# Patient Record
Sex: Male | Born: 1955
Health system: Southern US, Community
[De-identification: ages and names within clinical notes are randomized; demographics above are authoritative.]

## PROBLEM LIST (undated history)

## (undated) DIAGNOSIS — H9190 Unspecified hearing loss, unspecified ear: Secondary | ICD-10-CM

## (undated) DIAGNOSIS — N2889 Other specified disorders of kidney and ureter: Secondary | ICD-10-CM

## (undated) DIAGNOSIS — I4891 Unspecified atrial fibrillation: Secondary | ICD-10-CM

## (undated) DIAGNOSIS — I5023 Acute on chronic systolic (congestive) heart failure: Secondary | ICD-10-CM

## (undated) DIAGNOSIS — I499 Cardiac arrhythmia, unspecified: Secondary | ICD-10-CM

## (undated) DIAGNOSIS — C801 Malignant (primary) neoplasm, unspecified: Secondary | ICD-10-CM

## (undated) DIAGNOSIS — I10 Essential (primary) hypertension: Secondary | ICD-10-CM

## (undated) DIAGNOSIS — Z973 Presence of spectacles and contact lenses: Secondary | ICD-10-CM

## (undated) HISTORY — PX: HYPOSPADIAS CORRECTION: SHX483

## (undated) HISTORY — PX: WISDOM TOOTH EXTRACTION: SHX21

## (undated) HISTORY — PX: COLONOSCOPY: SHX5424

---

## 2014-04-17 ENCOUNTER — Emergency Department (HOSPITAL_COMMUNITY)
Admission: EM | Admit: 2014-04-17 | Discharge: 2014-04-17 | Disposition: A | Discharge status: Home / Self Care | Payer: 59 | Attending: Emergency Medicine | Admitting: Emergency Medicine

## 2014-04-17 DIAGNOSIS — IMO0001 Reserved for inherently not codable concepts without codable children: Secondary | ICD-10-CM

## 2014-04-17 DIAGNOSIS — Z79899 Other long term (current) drug therapy: Secondary | ICD-10-CM | POA: Diagnosis not present

## 2014-04-17 DIAGNOSIS — R030 Elevated blood-pressure reading, without diagnosis of hypertension: Secondary | ICD-10-CM | POA: Insufficient documentation

## 2014-04-17 LAB — COMPREHENSIVE METABOLIC PANEL
ALT: 14 U/L (ref 0–53)
AST: 18 U/L (ref 0–37)
Albumin: 4.3 g/dL (ref 3.5–5.2)
Alkaline Phosphatase: 44 U/L (ref 39–117)
Anion gap: 13 (ref 5–15)
BUN: 12 mg/dL (ref 6–23)
CO2: 27 mEq/L (ref 19–32)
Calcium: 9.3 mg/dL (ref 8.4–10.5)
Chloride: 99 mEq/L (ref 96–112)
Creatinine, Ser: 0.82 mg/dL (ref 0.50–1.35)
GFR calc Af Amer: >90 mL/min (ref >90)
GFR calc non Af Amer: >90 mL/min (ref >90)
Glucose, Bld: 100 mg/dL — ABNORMAL HIGH (ref 70–99)
Potassium: 3.6 mEq/L — ABNORMAL LOW (ref 3.7–5.3)
Sodium: 139 mEq/L (ref 137–147)
Total Bilirubin: 0.6 mg/dL (ref 0.3–1.2)
Total Protein: 7.3 g/dL (ref 6.0–8.3)

## 2014-04-17 LAB — CBC
HCT: 38.9 % — ABNORMAL LOW (ref 39.0–52.0)
Hemoglobin: 13.2 g/dL (ref 13.0–17.0)
MCH: 30.7 pg (ref 26.0–34.0)
MCHC: 33.9 g/dL (ref 30.0–36.0)
MCV: 90.5 fL (ref 78.0–100.0)
Platelets: 282 10*3/uL (ref 150–400)
RBC: 4.3 MIL/uL (ref 4.22–5.81)
RDW: 12.9 % (ref 11.5–15.5)
WBC: 5.6 10*3/uL (ref 4.0–10.5)

## 2014-04-17 LAB — I-STAT TROPONIN, ED: Troponin i, poc: 0 ng/mL (ref 0.00–0.08)

## 2014-04-17 MED ORDER — HYDROCHLOROTHIAZIDE 25 MG PO TABS: 25.0000 mg | ORAL_TABLET | Freq: Every day | ORAL | Status: DC

## 2014-04-17 MED ORDER — HYDROCHLOROTHIAZIDE 25 MG PO TABS
25.0000 mg | ORAL_TABLET | Freq: Once | ORAL | Status: AC
  Administered 2014-04-17: 25 mg via ORAL
  Filled 2014-04-17: qty 1

## 2014-04-17 NOTE — Discharge Instructions (Signed)
Take HCTZ daily for blood pressure. Follow up with priamary care doctor in 2 wks for recheck. Return to er if any chest pain, visual changes, headache, weakness or numbness, or any new concerning symptom.    Hypertension Hypertension, commonly called high blood pressure, is when the force of blood pumping through your arteries is too strong. Your arteries are the blood vessels that carry blood from your heart throughout your body. A blood pressure reading consists of a higher number over a lower number, such as 110/72. The higher number (systolic) is the pressure inside your arteries when your heart pumps. The lower number (diastolic) is the pressure inside your arteries when your heart relaxes. Ideally you want your blood pressure below 120/80. Hypertension forces your heart to work harder to pump blood. Your arteries may become narrow or stiff. Having hypertension puts you at risk for heart disease, stroke, and other problems.  RISK FACTORS Some risk factors for high blood pressure are controllable. Others are not.  Risk factors you cannot control include:   Race. You may be at higher risk if you are African American.  Age. Risk increases with age.  Gender. Men are at higher risk than women before age 42 years. After age 27, women are at higher risk than men. Risk factors you can control include:  Not getting enough exercise or physical activity.  Being overweight.  Getting too much fat, sugar, calories, or salt in your diet.  Drinking too much alcohol. SIGNS AND SYMPTOMS Hypertension does not usually cause signs or symptoms. Extremely high blood pressure (hypertensive crisis) may cause headache, anxiety, shortness of breath, and nosebleed. DIAGNOSIS  To check if you have hypertension, your health care provider will measure your blood pressure while you are seated, with your arm held at the level of your heart. It should be measured at least twice using the same arm. Certain conditions  can cause a difference in blood pressure between your right and left arms. A blood pressure reading that is higher than normal on one occasion does not mean that you need treatment. If one blood pressure reading is high, ask your health care provider about having it checked again. TREATMENT  Treating high blood pressure includes making lifestyle changes and possibly taking medicine. Living a healthy lifestyle can help lower high blood pressure. You may need to change some of your habits. Lifestyle changes may include:  Following the DASH diet. This diet is high in fruits, vegetables, and whole grains. It is low in salt, red meat, and added sugars.  Getting at least 2 hours of brisk physical activity every week.  Losing weight if necessary.  Not smoking.  Limiting alcoholic beverages.  Learning ways to reduce stress. If lifestyle changes are not enough to get your blood pressure under control, your health care provider may prescribe medicine. You may need to take more than one. Work closely with your health care provider to understand the risks and benefits. HOME CARE INSTRUCTIONS  Have your blood pressure rechecked as directed by your health care provider.   Take medicines only as directed by your health care provider. Follow the directions carefully. Blood pressure medicines must be taken as prescribed. The medicine does not work as well when you skip doses. Skipping doses also puts you at risk for problems.   Do not smoke.   Monitor your blood pressure at home as directed by your health care provider. SEEK MEDICAL CARE IF:   You think you are having a reaction  to medicines taken.  You have recurrent headaches or feel dizzy.  You have swelling in your ankles.  You have trouble with your vision. SEEK IMMEDIATE MEDICAL CARE IF:  You develop a severe headache or confusion.  You have unusual weakness, numbness, or feel faint.  You have severe chest or abdominal  pain.  You vomit repeatedly.  You have trouble breathing. MAKE SURE YOU:   Understand these instructions.  Will watch your condition.  Will get help right away if you are not doing well or get worse. Document Released: 05/18/2005 Document Revised: 10/02/2013 Document Reviewed: 03/10/2013 [facility] Patient Information 2015 [place], [place]. This information is not intended to replace advice given to you by your health care provider. Make sure you discuss any questions you have with your health care provider.   DASH Eating Plan DASH stands for "Dietary Approaches to Stop Hypertension." The DASH eating plan is a healthy eating plan that has been shown to reduce high blood pressure (hypertension). Additional health benefits may include reducing the risk of type 2 diabetes mellitus, heart disease, and stroke. The DASH eating plan may also help with weight loss. WHAT DO I NEED TO KNOW ABOUT THE DASH EATING PLAN? For the DASH eating plan, you will follow these general guidelines:  Choose foods with a percent daily value for sodium of less than 5% (as listed on the food label).  Use salt-free seasonings or herbs instead of table salt or sea salt.  Check with your health care provider or pharmacist before using salt substitutes.  Eat lower-sodium products, often labeled as "lower sodium" or "no salt added."  Eat fresh foods.  Eat more vegetables, fruits, and low-fat dairy products.  Choose whole grains. Look for the word "whole" as the first word in the ingredient list.  Choose fish and skinless chicken or [place] more often than red meat. Limit fish, poultry, and meat to 6 oz (170 g) each day.  Limit sweets, desserts, sugars, and sugary drinks.  Choose heart-healthy fats.  Limit cheese to 1 oz (28 g) per day.  Eat more home-cooked food and less restaurant, buffet, and fast food.  Limit fried foods.  Cook foods using methods other than frying.  Limit canned vegetables. If you do  use them, rinse them well to decrease the sodium.  When eating at a restaurant, ask that your food be prepared with less salt, or no salt if possible. WHAT FOODS CAN I EAT? Seek help from a dietitian for individual calorie needs. Grains Whole grain or whole wheat bread. Brown rice. Whole grain or whole wheat pasta. Quinoa, bulgur, and whole grain cereals. Low-sodium cereals. Corn or whole wheat flour tortillas. Whole grain cornbread. Whole grain crackers. Low-sodium crackers. Vegetables Fresh or frozen vegetables (raw, steamed, roasted, or grilled). Low-sodium or reduced-sodium tomato and vegetable juices. Low-sodium or reduced-sodium tomato sauce and paste. Low-sodium or reduced-sodium canned vegetables.  Fruits All fresh, canned (in natural juice), or frozen fruits. Meat and Other Protein Products Ground beef (85% or leaner), grass-fed beef, or beef trimmed of fat. Skinless chicken or [place]. Ground chicken or [place]. Pork trimmed of fat. All fish and seafood. Eggs. Dried beans, peas, or lentils. Unsalted nuts and seeds. Unsalted canned beans. Dairy Low-fat dairy products, such as skim or 1% milk, 2% or reduced-fat cheeses, low-fat ricotta or cottage cheese, or plain low-fat yogurt. Low-sodium or reduced-sodium cheeses. Fats and Oils Tub margarines without trans fats. Light or reduced-fat mayonnaise and salad dressings (reduced sodium). Avocado. Safflower, olive, or  canola oils. Natural peanut or almond butter. Other Unsalted popcorn and pretzels. The items listed above may not be a complete list of recommended foods or beverages. Contact your dietitian for more options. WHAT FOODS ARE NOT RECOMMENDED? Grains White bread. White pasta. White rice. Refined cornbread. Bagels and croissants. Crackers that contain trans fat. Vegetables Creamed or fried vegetables. Vegetables in a cheese sauce. Regular canned vegetables. Regular canned tomato sauce and paste. Regular tomato and vegetable  juices. Fruits Dried fruits. Canned fruit in light or heavy syrup. Fruit juice. Meat and Other Protein Products Fatty cuts of meat. Ribs, chicken wings, bacon, sausage, bologna, salami, chitterlings, fatback, hot dogs, bratwurst, and packaged luncheon meats. Salted nuts and seeds. Canned beans with salt. Dairy Whole or 2% milk, cream, half-and-half, and cream cheese. Whole-fat or sweetened yogurt. Full-fat cheeses or blue cheese. Nondairy creamers and whipped toppings. Processed cheese, cheese spreads, or cheese curds. Condiments Onion and garlic salt, seasoned salt, table salt, and sea salt. Canned and packaged gravies. Worcestershire sauce. Tartar sauce. Barbecue sauce. Teriyaki sauce. Soy sauce, including reduced sodium. Steak sauce. Fish sauce. Oyster sauce. Cocktail sauce. Horseradish. Ketchup and mustard. Meat flavorings and tenderizers. Bouillon cubes. Hot sauce. Tabasco sauce. Marinades. Taco seasonings. Relishes. Fats and Oils Butter, stick margarine, lard, shortening, ghee, and bacon fat. Coconut, palm kernel, or palm oils. Regular salad dressings. Other Pickles and olives. Salted popcorn and pretzels. The items listed above may not be a complete list of foods and beverages to avoid. Contact your dietitian for more information. WHERE CAN I FIND MORE INFORMATION? National Heart, Lung, and Blood Institute: travelstabloid.com Document Released: 05/07/2011 Document Revised: 10/02/2013 Document Reviewed: 03/22/2013 [facility] Patient Information 2015 [place], [place]. This information is not intended to replace advice given to you by your health care provider. Make sure you discuss any questions you have with your health care provider.

## 2014-04-17 NOTE — ED Provider Notes (Signed)
CSN: [id number]     Arrival date & time 04/17/14  1141 History   First MD Initiated Contact with Patient 04/17/14 1338     Chief Complaint  Patient presents with  . Hypertension     (Consider location/radiation/quality/duration/timing/severity/associated sxs/prior Treatment) HPI Theodore Weber is a 58 y.o. male with no known medical problems, presents to emergency department with elevated blood pressure. Patient states he checked his blood pressure work today, after his face was red. Blood pressure read 073 systolic. Patient's boss and coworkers told him to report to emergency department. Patient states that he does not have a primary care doctor and has not been seen for over 10 years. He states he is active, nonsmoker, occasional drinker. No family history of major heart problems or hypertension. He states he eats fairly healthy. States his job is very stressful and thinks maybe that is why his blood pressure is high. Patient denies any recent episodes of headaches, chest pain, weakness, swelling in extremities, dizziness. He has never been diagnosed with any problems with his blood pressure, heart problems, lung problems. He has not taken any medications. He is currently taking fish oil and St. John's wort.  No past medical history on file. No past surgical history on file. No family history on file. History  Substance Use Topics  . Smoking status: Not on file  . Smokeless tobacco: Not on file  . Alcohol Use: Not on file    Review of Systems  Constitutional: Negative for fever and chills.  Eyes: Negative for visual disturbance.  Respiratory: Negative for cough, chest tightness and shortness of breath.   Cardiovascular: Negative for chest pain, palpitations and leg swelling.  Gastrointestinal: Negative for nausea, vomiting, abdominal pain, diarrhea and abdominal distention.  Genitourinary: Negative for dysuria.  Musculoskeletal: Negative for myalgias, arthralgias, neck pain and neck  stiffness.  Skin: Negative for rash.  Allergic/Immunologic: Negative for immunocompromised state.  Neurological: Negative for dizziness, facial asymmetry, weakness, light-headedness, numbness and headaches.  All other systems reviewed and are negative.     Allergies  Review of patient's allergies indicates no known allergies.  Home Medications   Prior to Admission medications   Medication Sig Start Date End Date Taking? Authorizing Provider  Omega-3 Fatty Acids (FISH OIL PO) Take 1 tablet by mouth daily.   Yes Historical Provider, MD  [facility] Wort 300 MG TABS Take 1 tablet by mouth daily.   Yes Historical Provider, MD   BP 179/100 mmHg  Pulse 87  Temp(Src) 98.2 F (36.8 C) (Oral)  Resp 20  SpO2 99% Physical Exam  Constitutional: He is oriented to person, place, and time. He appears well-developed and well-nourished. No distress.  HENT:  Head: Normocephalic and atraumatic.  Eyes: Conjunctivae are normal.  Neck: Neck supple.  Cardiovascular: Normal rate, regular rhythm and normal heart sounds.   Pulmonary/Chest: Effort normal. No respiratory distress. He has no wheezes. He has no rales.  Abdominal: Soft. Bowel sounds are normal. He exhibits no distension. There is no tenderness. There is no rebound.  Musculoskeletal: He exhibits no edema.  Neurological: He is alert and oriented to person, place, and time. No cranial nerve deficit. Coordination normal.  Skin: Skin is warm and dry.  Nursing note and vitals reviewed.   ED Course  Procedures (including critical care time) Labs Review Labs Reviewed  CBC - Abnormal; Notable for the following:    HCT 38.9 (*)    All other components within normal limits  COMPREHENSIVE METABOLIC PANEL - Abnormal;  Notable for the following:    Potassium 3.6 (*)    Glucose, Bld 100 (*)    All other components within normal limits  I-STAT TROPOININ, ED    Imaging Review No results found.   EKG Interpretation   Date/Time:  Tuesday  April 17 2014 11:52:05 EST Ventricular Rate:  75 PR Interval:  130 QRS Duration: 90 QT Interval:  392 QTC Calculation: 437 R Axis:   84 Text Interpretation:  Sinus rhythm with Premature atrial complexes  Otherwise normal ECG Confirmed by Jeneen Rinks  MD, [place] ([id number]) on 04/17/2014  2:03:24 PM      MDM   Final diagnoses:  Elevated blood pressure    Patient is here with elevated blood pressure, otherwise asymptomatic. Specifically no chest pain, shortness of breath, headache, dizziness, swelling of extremities. Blood pressure is 179/100. Normal heart rate, oxygen saturation, temperature. Labs all normal including ECG, CBC, BMP, troponin. Discussed with Dr. Jeneen Rinks. Will start on HCTZ, close follow up with pcp. Pt agree swith plan.   Filed Vitals:   04/17/14 1152 04/17/14 1407  BP: 179/100 164/94  Pulse: 87 67  Temp: 98.2 F (36.8 C)   TempSrc: Oral   Resp: 20 18  SpO2: 99% 100%       Renold Genta, PA-C 04/17/14 1602  Tanna Furry, MD 04/25/14 1335

## 2014-04-17 NOTE — ED Notes (Signed)
Unsure if bp high; stated, "face turning red."  No cp. Has tinnitus.

## 2016-12-30 DIAGNOSIS — N2889 Other specified disorders of kidney and ureter: Secondary | ICD-10-CM

## 2016-12-30 DIAGNOSIS — I4891 Unspecified atrial fibrillation: Secondary | ICD-10-CM

## 2016-12-30 HISTORY — DX: Unspecified atrial fibrillation: I48.91

## 2016-12-30 HISTORY — DX: Other specified disorders of kidney and ureter: N28.89

## 2017-01-11 DIAGNOSIS — Z01810 Encounter for preprocedural cardiovascular examination: Secondary | ICD-10-CM | POA: Diagnosis not present

## 2017-01-11 DIAGNOSIS — I4891 Unspecified atrial fibrillation: Secondary | ICD-10-CM | POA: Diagnosis not present

## 2017-01-11 DIAGNOSIS — I10 Essential (primary) hypertension: Secondary | ICD-10-CM | POA: Diagnosis not present

## 2017-01-12 DIAGNOSIS — I4891 Unspecified atrial fibrillation: Secondary | ICD-10-CM | POA: Diagnosis not present

## 2017-01-12 DIAGNOSIS — Z01810 Encounter for preprocedural cardiovascular examination: Secondary | ICD-10-CM | POA: Diagnosis not present

## 2017-01-13 ENCOUNTER — Inpatient Hospital Stay (HOSPITAL_COMMUNITY): Payer: 59

## 2017-01-13 ENCOUNTER — Encounter (HOSPITAL_COMMUNITY): Payer: Self-pay | Admitting: Physician Assistant

## 2017-01-13 ENCOUNTER — Inpatient Hospital Stay (HOSPITAL_COMMUNITY)
Admission: AD | Admit: 2017-01-13 | Discharge: 2017-01-16 | DRG: 286 | Disposition: A | Discharge status: Home / Self Care | Payer: 59 | Source: Other Acute Inpatient Hospital | Attending: Cardiovascular Disease | Admitting: Cardiovascular Disease

## 2017-01-13 DIAGNOSIS — R0600 Dyspnea, unspecified: Secondary | ICD-10-CM

## 2017-01-13 DIAGNOSIS — Z79899 Other long term (current) drug therapy: Secondary | ICD-10-CM | POA: Diagnosis not present

## 2017-01-13 DIAGNOSIS — I10 Essential (primary) hypertension: Secondary | ICD-10-CM

## 2017-01-13 DIAGNOSIS — Z79891 Long term (current) use of opiate analgesic: Secondary | ICD-10-CM

## 2017-01-13 DIAGNOSIS — N2889 Other specified disorders of kidney and ureter: Secondary | ICD-10-CM | POA: Diagnosis present

## 2017-01-13 DIAGNOSIS — E440 Moderate protein-calorie malnutrition: Secondary | ICD-10-CM | POA: Diagnosis present

## 2017-01-13 DIAGNOSIS — F1010 Alcohol abuse, uncomplicated: Secondary | ICD-10-CM | POA: Diagnosis present

## 2017-01-13 DIAGNOSIS — F17290 Nicotine dependence, other tobacco product, uncomplicated: Secondary | ICD-10-CM | POA: Diagnosis present

## 2017-01-13 DIAGNOSIS — I4891 Unspecified atrial fibrillation: Principal | ICD-10-CM

## 2017-01-13 DIAGNOSIS — I472 Ventricular tachycardia: Secondary | ICD-10-CM | POA: Diagnosis present

## 2017-01-13 DIAGNOSIS — Z8249 Family history of ischemic heart disease and other diseases of the circulatory system: Secondary | ICD-10-CM | POA: Diagnosis not present

## 2017-01-13 DIAGNOSIS — I428 Other cardiomyopathies: Secondary | ICD-10-CM | POA: Diagnosis not present

## 2017-01-13 DIAGNOSIS — I5021 Acute systolic (congestive) heart failure: Secondary | ICD-10-CM

## 2017-01-13 DIAGNOSIS — R0609 Other forms of dyspnea: Secondary | ICD-10-CM

## 2017-01-13 DIAGNOSIS — I110 Hypertensive heart disease with heart failure: Secondary | ICD-10-CM | POA: Diagnosis present

## 2017-01-13 DIAGNOSIS — Z6822 Body mass index (BMI) 22.0-22.9, adult: Secondary | ICD-10-CM

## 2017-01-13 DIAGNOSIS — I429 Cardiomyopathy, unspecified: Secondary | ICD-10-CM | POA: Diagnosis present

## 2017-01-13 HISTORY — DX: Unspecified atrial fibrillation: I48.91

## 2017-01-13 HISTORY — DX: Acute on chronic systolic (congestive) heart failure: I50.23

## 2017-01-13 HISTORY — DX: Other specified disorders of kidney and ureter: N28.89

## 2017-01-13 HISTORY — DX: Essential (primary) hypertension: I10

## 2017-01-13 LAB — ECHOCARDIOGRAM COMPLETE
E decel time: 144 msec
E/e' ratio: 14.34
FS: 18 % — AB (ref 28–44)
Height: 75 in
IVS/LV PW RATIO, ED: 0.92
LA ID, A-P, ES: 33 mm
LA diam end sys: 33 mm
LA diam index: 1.54 cm/m2
LA vol A4C: 58.1 ml
LA vol index: 33.3 mL/m2
LA vol: 71.2 mL
LV E/e' medial: 14.34
LV E/e'average: 14.34
LV PW d: 12 mm — AB (ref 0.6–1.1)
LV dias vol index: 64 mL/m2
LV dias vol: 136 mL (ref 62–150)
LV e' LATERAL: 7.88 cm/s
LV sys vol index: 51 mL/m2
LV sys vol: 110 mL — AB (ref 21–61)
LVOT SV: 33 mL
LVOT VTI: 14.4 cm
LVOT area: 2.27 cm2
LVOT diameter: 17 mm
LVOT peak grad rest: 3 mmHg
LVOT peak vel: 84.2 cm/s
Lateral S' vel: 9.6 cm/s
MV Dec: 144
MV Peak grad: 5 mmHg
MV pk E vel: 113 m/s
Simpson's disk: 19
Stroke v: 26 ml
TAPSE: 19.4 mm
TDI e' lateral: 7.88
TDI e' medial: 7.21
Weight: 3027.2 oz

## 2017-01-13 LAB — COMPREHENSIVE METABOLIC PANEL
ALT: 20 U/L (ref 17–63)
AST: 21 U/L (ref 15–41)
Albumin: 3.5 g/dL (ref 3.5–5.0)
Alkaline Phosphatase: 54 U/L (ref 38–126)
Anion gap: 10 (ref 5–15)
BUN: 9 mg/dL (ref 6–20)
CO2: 23 mmol/L (ref 22–32)
Calcium: 9.3 mg/dL (ref 8.9–10.3)
Chloride: 103 mmol/L (ref 101–111)
Creatinine, Ser: 0.93 mg/dL (ref 0.61–1.24)
GFR calc Af Amer: >60 mL/min (ref >60)
GFR calc non Af Amer: >60 mL/min (ref >60)
Glucose, Bld: 110 mg/dL — ABNORMAL HIGH (ref 65–99)
Potassium: 3.9 mmol/L (ref 3.5–5.1)
Sodium: 136 mmol/L (ref 135–145)
Total Bilirubin: 1.3 mg/dL — ABNORMAL HIGH (ref 0.3–1.2)
Total Protein: 6.9 g/dL (ref 6.5–8.1)

## 2017-01-13 LAB — LIPID PANEL
Cholesterol: 161 mg/dL (ref 0–200)
HDL: 64 mg/dL (ref >40)
LDL Cholesterol: 86 mg/dL (ref 0–99)
Total CHOL/HDL Ratio: 2.5 RATIO
Triglycerides: 56 mg/dL (ref <150)
VLDL: 11 mg/dL (ref 0–40)

## 2017-01-13 LAB — CBC WITH DIFFERENTIAL/PLATELET
Basophils Absolute: 0 10*3/uL (ref 0.0–0.1)
Basophils Relative: 1 %
Eosinophils Absolute: 0.1 10*3/uL (ref 0.0–0.7)
Eosinophils Relative: 1 %
HCT: 45.7 % (ref 39.0–52.0)
Hemoglobin: 16.2 g/dL (ref 13.0–17.0)
Lymphocytes Relative: 17 %
Lymphs Abs: 1.2 10*3/uL (ref 0.7–4.0)
MCH: 33.4 pg (ref 26.0–34.0)
MCHC: 35.4 g/dL (ref 30.0–36.0)
MCV: 94.2 fL (ref 78.0–100.0)
Monocytes Absolute: 0.5 10*3/uL (ref 0.1–1.0)
Monocytes Relative: 7 %
Neutro Abs: 5.1 10*3/uL (ref 1.7–7.7)
Neutrophils Relative %: 74 %
Platelets: 243 10*3/uL (ref 150–400)
RBC: 4.85 MIL/uL (ref 4.22–5.81)
RDW: 14.3 % (ref 11.5–15.5)
WBC: 6.8 10*3/uL (ref 4.0–10.5)

## 2017-01-13 LAB — TSH: TSH: 7.265 u[IU]/mL — ABNORMAL HIGH (ref 0.350–4.500)

## 2017-01-13 LAB — BRAIN NATRIURETIC PEPTIDE: B Natriuretic Peptide: 483.1 pg/mL — ABNORMAL HIGH (ref 0.0–100.0)

## 2017-01-13 LAB — MAGNESIUM: Magnesium: 2 mg/dL (ref 1.7–2.4)

## 2017-01-13 LAB — HEPARIN LEVEL (UNFRACTIONATED)
Heparin Unfractionated: 0.15 IU/mL — ABNORMAL LOW (ref 0.30–0.70)
Heparin Unfractionated: 0.14 IU/mL — ABNORMAL LOW (ref 0.30–0.70)

## 2017-01-13 MED ORDER — HEPARIN BOLUS VIA INFUSION
2500.0000 [IU] | INTRAVENOUS | Status: AC
  Administered 2017-01-13: 2500 [IU] via INTRAVENOUS
  Filled 2017-01-13: qty 2500

## 2017-01-13 MED ORDER — HEPARIN (PORCINE) IN NACL 100-0.45 UNIT/ML-% IJ SOLN
1800.0000 [IU]/h | INTRAMUSCULAR | Status: DC
  Administered 2017-01-13: 1400 [IU]/h via INTRAVENOUS
  Administered 2017-01-14: 1650 [IU]/h via INTRAVENOUS
  Filled 2017-01-13: qty 250

## 2017-01-13 MED ORDER — SODIUM CHLORIDE 0.9 % WEIGHT BASED INFUSION
1.0000 mL/kg/h | INTRAVENOUS | Status: DC
  Administered 2017-01-14: 1 mL/kg/h via INTRAVENOUS

## 2017-01-13 MED ORDER — ONDANSETRON HCL 4 MG/2ML IJ SOLN: 4.0000 mg | Freq: Four times a day (QID) | INTRAMUSCULAR | Status: DC | PRN

## 2017-01-13 MED ORDER — PERFLUTREN LIPID MICROSPHERE
1.0000 mL | INTRAVENOUS | Status: AC | PRN
  Administered 2017-01-13: 2 mL via INTRAVENOUS
  Filled 2017-01-13: qty 10

## 2017-01-13 MED ORDER — SODIUM CHLORIDE 0.9% FLUSH: 3.0000 mL | Freq: Two times a day (BID) | INTRAVENOUS | Status: DC

## 2017-01-13 MED ORDER — SODIUM CHLORIDE 0.9 % IV SOLN: 250.0000 mL | INTRAVENOUS | Status: DC | PRN

## 2017-01-13 MED ORDER — SODIUM CHLORIDE 0.9 % IV SOLN
250.0000 mL | INTRAVENOUS | Status: DC | PRN
  Administered 2017-01-14: 250 mL via INTRAVENOUS

## 2017-01-13 MED ORDER — SODIUM CHLORIDE 0.9 % IV SOLN
INTRAVENOUS | Status: DC
  Administered 2017-01-14: 06:00:00 via INTRAVENOUS

## 2017-01-13 MED ORDER — ACETAMINOPHEN 325 MG PO TABS: 650.0000 mg | ORAL_TABLET | ORAL | Status: DC | PRN

## 2017-01-13 MED ORDER — CARVEDILOL 3.125 MG PO TABS
3.1250 mg | ORAL_TABLET | Freq: Two times a day (BID) | ORAL | Status: DC
  Administered 2017-01-13 – 2017-01-14 (×3): 3.125 mg via ORAL
  Filled 2017-01-13 (×5): qty 1

## 2017-01-13 MED ORDER — SODIUM CHLORIDE 0.9% FLUSH: 3.0000 mL | INTRAVENOUS | Status: DC | PRN

## 2017-01-13 MED ORDER — ATORVASTATIN CALCIUM 80 MG PO TABS
80.0000 mg | ORAL_TABLET | ORAL | Status: AC
  Administered 2017-01-13: 80 mg via ORAL
  Filled 2017-01-13: qty 1

## 2017-01-13 MED ORDER — ASPIRIN 81 MG PO CHEW: 81.0000 mg | CHEWABLE_TABLET | ORAL | Status: DC

## 2017-01-13 MED ORDER — HEPARIN (PORCINE) IN NACL 100-0.45 UNIT/ML-% IJ SOLN
1200.0000 [IU]/h | INTRAMUSCULAR | Status: DC
  Administered 2017-01-13: 1200 [IU]/h via INTRAVENOUS

## 2017-01-13 MED ORDER — ENSURE ENLIVE PO LIQD
237.0000 mL | Freq: Two times a day (BID) | ORAL | Status: DC
  Administered 2017-01-14 – 2017-01-15 (×4): 237 mL via ORAL

## 2017-01-13 MED ORDER — DEXTROSE 5 % IV SOLN
5.0000 mg/h | INTRAVENOUS | Status: DC
  Administered 2017-01-13 – 2017-01-14 (×2): 5 mg/h via INTRAVENOUS
  Filled 2017-01-13: qty 100

## 2017-01-13 MED ORDER — ATORVASTATIN CALCIUM 80 MG PO TABS
80.0000 mg | ORAL_TABLET | Freq: Every day | ORAL | Status: DC
  Administered 2017-01-15 – 2017-01-16 (×2): 80 mg via ORAL
  Filled 2017-01-13 (×2): qty 1

## 2017-01-13 MED ORDER — SODIUM CHLORIDE 0.9 % WEIGHT BASED INFUSION
3.0000 mL/kg/h | INTRAVENOUS | Status: DC
  Administered 2017-01-14: 3 mL/kg/h via INTRAVENOUS

## 2017-01-13 MED ORDER — ASPIRIN EC 81 MG PO TBEC
81.0000 mg | DELAYED_RELEASE_TABLET | Freq: Every day | ORAL | Status: DC
  Administered 2017-01-13 – 2017-01-16 (×3): 81 mg via ORAL
  Filled 2017-01-13 (×3): qty 1

## 2017-01-13 MED ORDER — ASPIRIN 81 MG PO CHEW
81.0000 mg | CHEWABLE_TABLET | ORAL | Status: AC
  Administered 2017-01-14: 81 mg via ORAL
  Filled 2017-01-13: qty 1

## 2017-01-13 NOTE — Progress Notes (Signed)
Pt has been scheduled for right and left heart catheterization tomorrow 01/14/17 at 7:30 AM with Dr. Claiborne Billings. NPO at [place].    Tami Lin Duke, PA-C 01/13/2017, 9:00 AM [place]

## 2017-01-13 NOTE — Progress Notes (Signed)
ANTICOAGULATION CONSULT NOTE - Initial Consult  Pharmacy Consult for Heparin Indication: atrial fibrillation  No Known Allergies  Patient Measurements: Height: 6\' 3"  (190.5 cm) Weight: 189 lb 3.2 oz (85.8 kg) (scale c) IBW/kg (Calculated) : 84.5 Heparin Dosing Weight: 85.8 kg = TBW  Vital Signs: Temp: 97.9 F (36.6 C) (08/15 0623) Temp Source: Oral (08/15 6295) BP: 107/88 (08/15 0623) Pulse Rate: 112 (08/15 0623)  Labs: No results for input(s): HGB, HCT, PLT, APTT, LABPROT, INR, HEPARINUNFRC, HEPRLOWMOCWT, CREATININE, CKTOTAL, CKMB, TROPONINI in the last 72 hours.  CrCl cannot be calculated (Patient's most recent lab result is older than the maximum 21 days allowed.).   Medical History: Past Medical History:  Diagnosis Date  . Atrial fibrillation ([place]) 12/2016  . Hypertension   . Renal mass 12/2016  PMH significant for  alcohol use (4 beers daily)  Medications:  Prescriptions Prior to Admission  Medication Sig Dispense Refill Last Dose  . amLODipine (NORVASC) 10 MG tablet Take 10 mg by mouth daily.   Past Week at Unknown time  . OIL OF OREGANO PO Take 1 each by mouth See admin instructions. Take 1 dropper full by mouth daily   Past Week at Unknown time  . Omega-3 Fatty Acids (FISH OIL PO) Take 1 tablet by mouth daily.   Past Week at Unknown time  . OVER THE COUNTER MEDICATION Take 1 scoop by mouth daily. [place] Beets OTC   Past Week at Unknown time  . OVER THE COUNTER MEDICATION Take 1 each by mouth See admin instructions. Take 1 dropperfull by mouth daily. C02 Extracted Hemp Tincture 1000 mg   Past Week at Unknown time  . oxyCODONE (OXY IR/ROXICODONE) 5 MG immediate release tablet Take 5 mg by mouth every 4 (four) hours as needed for severe pain.   Past Week at Unknown time  . [place] Wort 300 MG TABS Take 300 mg by mouth daily.    Past Week at Unknown time  . hydrochlorothiazide (HYDRODIURIL) 25 MG tablet Take 1 tablet (25 mg total) by mouth daily. (Patient not  taking: Reported on 01/13/2017) 30 tablet 1 Not Taking at Unknown time   Scheduled:  . aspirin EC  81 mg Oral Daily  . carvedilol  3.125 mg Oral BID WC    Assessment: 61 y.o male transferred from [place] to [facility] on 01/13/17.  Presented to [facility] with several day of history of right flank pain. Imaging revealed right renal mass consistent with cancer. In the OR for surgery patient found to be in Afib RVR  Surgery was canceled. Anticoagulation was initially avoided given his pending right nephrectomy.  Patient transferred to [facility] for further ischemic evaluation and management of his Afib.  To start on IV heparin per pharmacy protocol. Not on oral anticoagulation PTA  Plan for right and left heart catheterization tomorrow 01/14/17 at 7:30 AM   Heparin started at [facility] . Currently infusing at  1200 units/hr.   Goal of Therapy:  Heparin level 0.3-0.7 units/ml Monitor platelets by anticoagulation protocol: Yes   Plan:  Continue IV Heparin infusing at  1200 units/hr. Check heparin level now.  Daily heparin level and CBD while on heparin drip.    Thank you for allowing pharmacy to be part of this patients care team. Nicole Cella, RPh Clinical Pharmacist Pager: [phone] [place] [phone] 01/13/2017,9:18 AM

## 2017-01-13 NOTE — Progress Notes (Signed)
  Echocardiogram 2D Echocardiogram has been performed.  Mahamed Zalewski 01/13/2017, 5:28 PM

## 2017-01-13 NOTE — Progress Notes (Signed)
Pharmacy called to verify the compatibility of heparin and cardizem. Echo called to verify scheduled echo. Per echo patient will have procedure tomorrow. This was not the understanding of the patient. He is scheduled for cardiac cath at 0730 and the echo was to be done prior. Dr. Gwenlyn Found paged and made aware of potential delay and that the patient had 27 runs of vtach. Patient is asymptomatic.

## 2017-01-13 NOTE — H&P (Signed)
History & Physical    Patient ID: Rondrick Barreira MRN: [id number], DOB/AGE: Sep 04, 1955   Admit date: 01/13/2017   Primary Physician: Patient, No Pcp Per Primary Cardiologist: new - Dr. Gwenlyn Found  Patient Profile    Mr. Withrow has a PMH significant for HTN and alcohol use (4 beers daily).   Past Medical History    Past Medical History:  Diagnosis Date  . Hypertension     No past surgical history on file.   Allergies  No Known Allergies  History of Present Illness    Mr. Heiland presented to [facility] with several day history of right flank pain. Imaging revealed right renal mass consistent with cancer. An elective right nephrectomy was planned; however, patient was found to be in Afib RVR in the OR by anesthesiology. Surgery was canceled.  Cardizem drip was started. Primary consulted cardiology who recommended a nuclear stress test in the setting of new onset Afib. Anticoagulation was initially avoided given his pending right nephrectomy. Nuclear stress test revealed small fixed defects at the apex, no stress-induced ischemia, severe hypokinesis of the left ventricle with EF estimated at 12%. This study was noted to be a high risk study. Echo at [facility] with EF of 40-45%.   Pt was transferred to [facility] for further ischemic evaluation and management of his Afib. Upon arrival (01/13/17), cardizem drip at 5 mg/hr and heparin drip running. He denies chest pain, palpitations, dizziness, nausea, and feelings of syncope. He reports worsening shortness of breath with activity since last winter. 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 He states he could not split as much wood this year secondary to dyspnea on exertion.   Home Medications    Prior to Admission medications   Medication Sig Start Date End Date Taking? Authorizing Provider  hydrochlorothiazide (HYDRODIURIL) 25 MG tablet Take 1 tablet (25 mg total) by mouth daily. 04/17/14   Kirichenko, Lahoma Rocker, PA-C    Omega-3 Fatty Acids (FISH OIL PO) Take 1 tablet by mouth daily.    [provider]  [facility] Wort 300 MG TABS Take 1 tablet by mouth daily.    [provider]    Family History    Family History  Problem Relation Age of Onset  . Hypertension Father     Social History    Social History   Social History  . Marital status: Married    Spouse name: N/A  . Number of children: N/A  . Years of education: N/A   Occupational History  . Not on file.   Social History Main Topics  . Smoking status: Current Every Day Smoker    Types: Cigars  . Smokeless tobacco: Not on file  . Alcohol use Not on file  . Drug use: Unknown  . Sexual activity: Not on file   Other Topics Concern  . Not on file   Social History Narrative  . No narrative on file     Review of Systems    General:  No chills, fever, night sweats or weight changes.  Cardiovascular:  No chest pain, dyspnea on exertion, edema, orthopnea, palpitations, paroxysmal nocturnal dyspnea. Dermatological: No rash, lesions/masses Respiratory: No cough, dyspnea Urologic: No hematuria, dysuria Abdominal:   No nausea, vomiting, diarrhea, bright red blood per rectum, melena, or hematemesis Neurologic:  No visual changes, wkns, changes in mental status. All other systems reviewed and are otherwise negative except as noted above.  Physical Exam    Blood pressure 107/88, pulse Marland Kitchen)  112, temperature 97.9 F (36.6 C), temperature source Oral, resp. rate 18, height 6\' 3"  (1.905 m), weight 189 lb 3.2 oz (85.8 kg), SpO2 97 %.  General: Pleasant, NAD Psych: Normal affect. Neuro: Alert and oriented X 3. Moves all extremities spontaneously. HEENT: Normal  Neck: Supple without bruits or JVD. Lungs:  Resp regular and unlabored, CTA. Heart: Irregular rhythm, regular rate,  no murmurs. Abdomen: Soft, non-tender, non-distended, BS + x 4.  Extremities: No clubbing, cyanosis or edema. DP/PT/Radials 2+ and equal  bilaterally.  Labs    Troponin (Point of Care Test) No results for input(s): TROPIPOC in the last 72 hours. No results for input(s): CKTOTAL, CKMB, TROPONINI in the last 72 hours. Lab Results  Component Value Date   WBC 5.6 04/17/2014   HGB 13.2 04/17/2014   HCT 38.9 (L) 04/17/2014   MCV 90.5 04/17/2014   PLT 282 04/17/2014   No results for input(s): NA, K, CL, CO2, BUN, CREATININE, CALCIUM, PROT, BILITOT, ALKPHOS, ALT, AST, GLUCOSE in the last 168 hours.  Invalid input(s): LABALBU No results found for: CHOL, HDL, LDLCALC, TRIG No results found for: [facility]   Radiology Studies    No results found.  ECG & Cardiac Imaging    EKG pending  Nuclear stress test 01/12/17: Small fixed defects at the apex, no stress-induced ischemia, severe hypokinesis of the left ventricle with EF estimated at 12%.  Echo Oval Linsey) LVEF 40-45% Right ventricle dilated Left ventricle moderate moderately dilated   Assessment & Plan    1. Atrial fibrillation - telemetry with Afib with rates in the 120s, now rate controlled in the 90s - pt is unaware of his Afib, denies palpitations, dizziness, and chest pain - cardizem drip running at 5 mg/hr - heparin drip running This patients CHA2DS2-VASc Score and unadjusted Ischemic Stroke Rate (% per year) is equal to 2.2 % stroke rate/year from a score of 2 (CHF, HTN) - nuclear stress test at [facility] without reversible ischemia - echo with new reduced LVEF - will review stress images and echo from [place] with attending and decide need for further ischemic evaluation - keep NPO for now - telemetry review with 18 beat run of NSVT - will repeat echo here - started coreg 3.125 mg BID - anticipate Right/Left heart cath tomorrow   2. HTN - home med HCTZ - will hold this while titrating rate-controlling agents   3. New systolic heart failure - it is unclear if this is related to Afib, alcohol use, or HTN - patient is not in fluid overload on exam -  right/left heart cath tomorrow   4. Alcohol use - pt denies withdrawal  - will monitor, no CIWA ordered yet   5. Right renal mass - defer to nephrology, will undergo cardiac workup first before surgery rescheduled - labs pending    Signed, Ledora Bottcher, PA-C 01/13/2017, 7:54 AM   Agree with note by Fabian Sharp PA-C  61 year old thin-appearing married Caucasian male transferred from [facility] early this morning for newly recognized atrial fibrillation. He was on the operating table for a right nephrectomy and this was because of A. fib recognized by anesthesia. He drinks 5-6 beers a day and has treated hypertension on amlodipine. He has never had a heart attack or stroke. He has noticed decreased exercise tolerance over the last year or so but denies chest pain. He had a Myoview that showed no ischemia or infarct with an EF of 12% however 2-D echo revealed an EF in the  40-45% range. He is on diltiazem drip for rate control with a heart rate in the 90s was IV heparin.The CHA2DSVASC2 score is  2 . He will ultimately require oral anticoagulation. In the meantime, I will repeat a 2-D echo to A. fib have a right and left heart cath tomorrow morning to define his anatomy and physiology in preparation for risk stratification for his right nephrectomy. We will get urology service here [facility] involved as well.   Lorretta Harp, M.D., [place], [facility], [place] [address] [place], [place]  [address]  [phone] 01/13/2017 9:11 AM

## 2017-01-13 NOTE — Progress Notes (Signed)
ANTICOAGULATION CONSULT NOTE -follow up Pharmacy Consult for Heparin Indication: atrial fibrillation new onset  No Known Allergies  Patient Measurements: Height: 6\' 3"  (190.5 cm) Weight: 189 lb 3.2 oz (85.8 kg) (scale c) IBW/kg (Calculated) : 84.5 Heparin Dosing Weight: 85.8 kg = TBW  Vital Signs: Temp: 98.9 F (37.2 C) (08/15 2023) Temp Source: Oral (08/15 2023) BP: 107/75 (08/15 2023) Pulse Rate: 93 (08/15 2023)  Labs:  Recent Labs  01/13/17 0938 01/13/17 2019  HGB 16.2  --   HCT 45.7  --   PLT 243  --   HEPARINUNFRC 0.15* 0.14*  CREATININE 0.93  --     Estimated Creatinine Clearance: 102.2 mL/min (by C-G formula based on SCr of 0.93 mg/dL).   Assessment: 61 y.o male transferred from [place] to [facility] on 01/13/17 for further ischemic evaluation and management of his Afib.  Started on IV heparin on 01/12/17 at [facility].   Heparin level low this PM at 0.14  Goal of Therapy:  Heparin level 0.3-0.7 units/ml Monitor platelets by anticoagulation protocol: Yes   Plan:  Increase heparin to 1650 units / hr Follow up AM labs  Cath planned for early AM  Thank  You Anette Guarneri, PharmD [phone] 01/13/2017,9:54 PM

## 2017-01-13 NOTE — Progress Notes (Signed)
ANTICOAGULATION CONSULT NOTE -follow up Pharmacy Consult for Heparin Indication: atrial fibrillation new onset  No Known Allergies  Patient Measurements: Height: 6\' 3"  (190.5 cm) Weight: 189 lb 3.2 oz (85.8 kg) (scale c) IBW/kg (Calculated) : 84.5 Heparin Dosing Weight: 85.8 kg = TBW  Vital Signs: Temp: 97.9 F (36.6 C) (08/15 0623) Temp Source: Oral (08/15 0623) BP: 107/88 (08/15 0623) Pulse Rate: 112 (08/15 0623)  Labs:  Recent Labs  01/13/17 0938  HGB 16.2  HCT 45.7  PLT 243  HEPARINUNFRC 0.15*  CREATININE 0.93    Estimated Creatinine Clearance: 102.2 mL/min (by C-G formula based on SCr of 0.93 mg/dL).   Medical History: Past Medical History:  Diagnosis Date  . Atrial fibrillation ([place]) 12/2016  . Hypertension   . Renal mass 12/2016  PMH significant for  alcohol use (4 beers daily)  Medications:  Prescriptions Prior to Admission  Medication Sig Dispense Refill Last Dose  . amLODipine (NORVASC) 10 MG tablet Take 10 mg by mouth daily.   Past Week at Unknown time  . OIL OF OREGANO PO Take 1 each by mouth See admin instructions. Take 1 dropper full by mouth daily   Past Week at Unknown time  . Omega-3 Fatty Acids (FISH OIL PO) Take 1 tablet by mouth daily.   Past Week at Unknown time  . OVER THE COUNTER MEDICATION Take 1 scoop by mouth daily. [place] Beets OTC   Past Week at Unknown time  . OVER THE COUNTER MEDICATION Take 1 each by mouth See admin instructions. Take 1 dropperfull by mouth daily. C02 Extracted Hemp Tincture 1000 mg   Past Week at Unknown time  . oxyCODONE (OXY IR/ROXICODONE) 5 MG immediate release tablet Take 5 mg by mouth every 4 (four) hours as needed for severe pain.   Past Week at Unknown time  . [place] Wort 300 MG TABS Take 300 mg by mouth daily.    Past Week at Unknown time  . hydrochlorothiazide (HYDRODIURIL) 25 MG tablet Take 1 tablet (25 mg total) by mouth daily. (Patient not taking: Reported on 01/13/2017) 30 tablet 1 Not Taking at  Unknown time   Scheduled:  . aspirin EC  81 mg Oral Daily  . carvedilol  3.125 mg Oral BID WC  . feeding supplement (ENSURE ENLIVE)  237 mL Oral BID BM    Assessment: 61 y.o male transferred from [place] to [facility] on 01/13/17 for further ischemic evaluation and management of his Afib.  Started on IV heparin on 01/12/17 at [facility]. Heparin drip 1200 units/hr continues here per pharmacy protocol. Not on oral anticoagulation PTA.   This AM @ 09:30 the heparin level = 0.15 on 1200 units/hr IV heparin drip.  CBC wnl and no bleeding noted.    Goal of Therapy:  Heparin level 0.3-0.7 units/ml Monitor platelets by anticoagulation protocol: Yes   Plan:  Bolus heparin 2500 units IV x1 Increase heparin drip rate to 1400 units/hr Check heparin level in 6 hours.  Daily heparin level and CBD while on heparin drip.  Plan for right and left heart catheterization tomorrow 01/14/17 at 7:30 AM   Thank you for allowing pharmacy to be part of this patients care team. Nicole Cella, [place] Clinical Pharmacist Pager: [phone]-4P [phone] [place] [phone] 01/13/2017,1:16 PM

## 2017-01-13 NOTE — Progress Notes (Signed)
Spoke with Dr [name] made aware of pts admission. Updated with ongoing drips of Cardizem and Heparin. Updated also with VS. Awaiting for Dr bensimhons orders.

## 2017-01-14 ENCOUNTER — Other Ambulatory Visit: Payer: Self-pay

## 2017-01-14 ENCOUNTER — Inpatient Hospital Stay (HOSPITAL_COMMUNITY)
Admission: AD | Disposition: A | Discharge status: Home / Self Care | Payer: Self-pay | Source: Other Acute Inpatient Hospital | Attending: Cardiovascular Disease

## 2017-01-14 ENCOUNTER — Other Ambulatory Visit (HOSPITAL_COMMUNITY): Payer: 59

## 2017-01-14 ENCOUNTER — Encounter (HOSPITAL_COMMUNITY): Payer: Self-pay | Admitting: Cardiovascular Disease

## 2017-01-14 DIAGNOSIS — I428 Other cardiomyopathies: Secondary | ICD-10-CM

## 2017-01-14 DIAGNOSIS — E440 Moderate protein-calorie malnutrition: Secondary | ICD-10-CM | POA: Insufficient documentation

## 2017-01-14 HISTORY — PX: RIGHT/LEFT HEART CATH AND CORONARY ANGIOGRAPHY: CATH118266

## 2017-01-14 LAB — BASIC METABOLIC PANEL
Anion gap: 9 (ref 5–15)
BUN: 9 mg/dL (ref 6–20)
CO2: 24 mmol/L (ref 22–32)
Calcium: 9 mg/dL (ref 8.9–10.3)
Chloride: 103 mmol/L (ref 101–111)
Creatinine, Ser: 0.85 mg/dL (ref 0.61–1.24)
GFR calc Af Amer: >60 mL/min (ref >60)
GFR calc non Af Amer: >60 mL/min (ref >60)
Glucose, Bld: 97 mg/dL (ref 65–99)
Potassium: 3.7 mmol/L (ref 3.5–5.1)
Sodium: 136 mmol/L (ref 135–145)

## 2017-01-14 LAB — POCT I-STAT 3, VENOUS BLOOD GAS (G3P V)
Bicarbonate: 24.3 mmol/L (ref 20.0–28.0)
Bicarbonate: 24.8 mmol/L (ref 20.0–28.0)
O2 Saturation: 69 %
O2 Saturation: 69 %
TCO2: 26 mmol/L (ref 0–100)
TCO2: 26 mmol/L (ref 0–100)
pCO2, Ven: 39.2 mmHg — ABNORMAL LOW (ref 44.0–60.0)
pCO2, Ven: 40.6 mmHg — ABNORMAL LOW (ref 44.0–60.0)
pH, Ven: 7.394 (ref 7.250–7.430)
pH, Ven: 7.401 (ref 7.250–7.430)
pO2, Ven: 36 mmHg (ref 32.0–45.0)
pO2, Ven: 36 mmHg (ref 32.0–45.0)

## 2017-01-14 LAB — HEMOGLOBIN A1C
Hgb A1c MFr Bld: 5.2 % (ref 4.8–5.6)
Mean Plasma Glucose: 103 mg/dL

## 2017-01-14 LAB — POCT I-STAT 3, ART BLOOD GAS (G3+)
Acid-base deficit: 1 mmol/L (ref 0.0–2.0)
Bicarbonate: 22.9 mmol/L (ref 20.0–28.0)
O2 Saturation: 98 %
TCO2: 24 mmol/L (ref 0–100)
pCO2 arterial: 34.1 mmHg (ref 32.0–48.0)
pH, Arterial: 7.436 (ref 7.350–7.450)
pO2, Arterial: 97 mmHg (ref 83.0–108.0)

## 2017-01-14 LAB — CBC
HCT: 45.2 % (ref 39.0–52.0)
Hemoglobin: 15.5 g/dL (ref 13.0–17.0)
MCH: 32.7 pg (ref 26.0–34.0)
MCHC: 34.3 g/dL (ref 30.0–36.0)
MCV: 95.4 fL (ref 78.0–100.0)
Platelets: 233 10*3/uL (ref 150–400)
RBC: 4.74 MIL/uL (ref 4.22–5.81)
RDW: 14.3 % (ref 11.5–15.5)
WBC: 6.3 10*3/uL (ref 4.0–10.5)

## 2017-01-14 LAB — PROTIME-INR
INR: 1.11
Prothrombin Time: 14.4 seconds (ref 11.4–15.2)

## 2017-01-14 LAB — HIV ANTIBODY (ROUTINE TESTING W REFLEX): HIV Screen 4th Generation wRfx: NONREACTIVE

## 2017-01-14 LAB — HEPARIN LEVEL (UNFRACTIONATED): Heparin Unfractionated: 0.22 IU/mL — ABNORMAL LOW (ref 0.30–0.70)

## 2017-01-14 LAB — POCT ACTIVATED CLOTTING TIME: Activated Clotting Time: 109 seconds

## 2017-01-14 SURGERY — RIGHT/LEFT HEART CATH AND CORONARY ANGIOGRAPHY
Anesthesia: LOCAL

## 2017-01-14 MED ORDER — DIGOXIN 125 MCG PO TABS
0.1250 mg | ORAL_TABLET | Freq: Every day | ORAL | Status: DC
  Administered 2017-01-14 – 2017-01-16 (×3): 0.125 mg via ORAL
  Filled 2017-01-14 (×3): qty 1

## 2017-01-14 MED ORDER — AMIODARONE LOAD VIA INFUSION
150.0000 mg | Freq: Once | INTRAVENOUS | Status: AC
  Administered 2017-01-14: 150 mg via INTRAVENOUS
  Filled 2017-01-14: qty 83.34

## 2017-01-14 MED ORDER — SODIUM CHLORIDE 0.9 % IV SOLN: 250.0000 mL | INTRAVENOUS | Status: DC | PRN

## 2017-01-14 MED ORDER — LIDOCAINE HCL (PF) 1 % IJ SOLN
INTRAMUSCULAR | Status: AC
  Filled 2017-01-14: qty 30

## 2017-01-14 MED ORDER — LIDOCAINE HCL (PF) 1 % IJ SOLN
INTRAMUSCULAR | Status: DC | PRN
  Administered 2017-01-14: 15 mL
  Administered 2017-01-14 (×2): 2 mL

## 2017-01-14 MED ORDER — HEPARIN SODIUM (PORCINE) 1000 UNIT/ML IJ SOLN
INTRAMUSCULAR | Status: AC
  Filled 2017-01-14: qty 1

## 2017-01-14 MED ORDER — AMIODARONE HCL IN DEXTROSE 360-4.14 MG/200ML-% IV SOLN
60.0000 mg/h | INTRAVENOUS | Status: AC
  Administered 2017-01-14 (×2): 60 mg/h via INTRAVENOUS
  Filled 2017-01-14: qty 200

## 2017-01-14 MED ORDER — HEPARIN (PORCINE) IN NACL 100-0.45 UNIT/ML-% IJ SOLN: 1800.0000 [IU]/h | INTRAMUSCULAR | Status: DC

## 2017-01-14 MED ORDER — SODIUM CHLORIDE 0.9% FLUSH: 3.0000 mL | INTRAVENOUS | Status: DC | PRN

## 2017-01-14 MED ORDER — ONDANSETRON HCL 4 MG/2ML IJ SOLN: 4.0000 mg | Freq: Four times a day (QID) | INTRAMUSCULAR | Status: DC | PRN

## 2017-01-14 MED ORDER — HEPARIN (PORCINE) IN NACL 2-0.9 UNIT/ML-% IJ SOLN
INTRAMUSCULAR | Status: DC | PRN
  Administered 2017-01-14: 09:00:00

## 2017-01-14 MED ORDER — MIDAZOLAM HCL 2 MG/2ML IJ SOLN
INTRAMUSCULAR | Status: AC
  Filled 2017-01-14: qty 2

## 2017-01-14 MED ORDER — IOPAMIDOL (ISOVUE-370) INJECTION 76%
INTRAVENOUS | Status: DC | PRN
Start: 2017-01-14 — End: 2017-01-14
  Administered 2017-01-14: 65 mL

## 2017-01-14 MED ORDER — DIAZEPAM 5 MG PO TABS: 5.0000 mg | ORAL_TABLET | Freq: Four times a day (QID) | ORAL | Status: DC | PRN

## 2017-01-14 MED ORDER — AMIODARONE HCL 200 MG PO TABS
400.0000 mg | ORAL_TABLET | Freq: Two times a day (BID) | ORAL | Status: DC
  Administered 2017-01-14: 400 mg via ORAL
  Filled 2017-01-14: qty 2

## 2017-01-14 MED ORDER — MIDAZOLAM HCL 2 MG/2ML IJ SOLN
INTRAMUSCULAR | Status: DC | PRN
  Administered 2017-01-14: 1 mg via INTRAVENOUS

## 2017-01-14 MED ORDER — SODIUM CHLORIDE 0.9 % IV SOLN: INTRAVENOUS | Status: AC

## 2017-01-14 MED ORDER — LOSARTAN POTASSIUM 25 MG PO TABS
12.5000 mg | ORAL_TABLET | Freq: Every day | ORAL | Status: DC
  Administered 2017-01-15: 12.5 mg via ORAL
  Filled 2017-01-14: qty 1

## 2017-01-14 MED ORDER — AMIODARONE HCL IN DEXTROSE 360-4.14 MG/200ML-% IV SOLN
30.0000 mg/h | INTRAVENOUS | Status: DC
  Administered 2017-01-14 – 2017-01-16 (×4): 30 mg/h via INTRAVENOUS
  Filled 2017-01-14 (×3): qty 200

## 2017-01-14 MED ORDER — HEPARIN (PORCINE) IN NACL 2-0.9 UNIT/ML-% IJ SOLN
INTRAMUSCULAR | Status: AC
  Filled 2017-01-14: qty 1000

## 2017-01-14 MED ORDER — VERAPAMIL HCL 2.5 MG/ML IV SOLN
INTRAVENOUS | Status: AC
  Filled 2017-01-14: qty 2

## 2017-01-14 MED ORDER — FENTANYL CITRATE (PF) 100 MCG/2ML IJ SOLN
INTRAMUSCULAR | Status: AC
  Filled 2017-01-14: qty 2

## 2017-01-14 MED ORDER — AMIODARONE HCL IN DEXTROSE 360-4.14 MG/200ML-% IV SOLN
30.0000 mg/h | INTRAVENOUS | Status: DC
  Filled 2017-01-14: qty 200

## 2017-01-14 MED ORDER — MIDAZOLAM HCL 2 MG/2ML IJ SOLN
INTRAMUSCULAR | Status: DC | PRN
  Administered 2017-01-14: 2 mg via INTRAVENOUS

## 2017-01-14 MED ORDER — IOPAMIDOL (ISOVUE-370) INJECTION 76%
INTRAVENOUS | Status: AC
  Filled 2017-01-14: qty 100

## 2017-01-14 MED ORDER — FENTANYL CITRATE (PF) 100 MCG/2ML IJ SOLN
INTRAMUSCULAR | Status: DC | PRN
  Administered 2017-01-14: 25 ug via INTRAVENOUS

## 2017-01-14 MED ORDER — SODIUM CHLORIDE 0.9% FLUSH
3.0000 mL | Freq: Two times a day (BID) | INTRAVENOUS | Status: DC
  Administered 2017-01-14 – 2017-01-15 (×3): 3 mL via INTRAVENOUS

## 2017-01-14 MED ORDER — ACETAMINOPHEN 325 MG PO TABS: 650.0000 mg | ORAL_TABLET | ORAL | Status: DC | PRN

## 2017-01-14 MED ORDER — SPIRONOLACTONE 25 MG PO TABS
12.5000 mg | ORAL_TABLET | Freq: Every day | ORAL | Status: DC
  Administered 2017-01-14: 12.5 mg via ORAL
  Filled 2017-01-14: qty 1

## 2017-01-14 MED ORDER — APIXABAN 5 MG PO TABS
5.0000 mg | ORAL_TABLET | Freq: Two times a day (BID) | ORAL | Status: DC
  Administered 2017-01-14 – 2017-01-16 (×4): 5 mg via ORAL
  Filled 2017-01-14 (×4): qty 1

## 2017-01-14 SURGICAL SUPPLY — 14 items
CATH BALLN WEDGE 5F 110CM (CATHETERS) ×2 IMPLANT
CATH INFINITI 5FR MULTPACK ANG (CATHETERS) ×2 IMPLANT
GLIDESHEATH SLEND SS 6F .021 (SHEATH) ×2 IMPLANT
GUIDEWIRE INQWIRE 1.5J.035X260 (WIRE) ×1 IMPLANT
INQWIRE 1.5J .035X260CM (WIRE) ×2
KIT HEART LEFT (KITS) ×2 IMPLANT
PACK CARDIAC CATHETERIZATION (CUSTOM PROCEDURE TRAY) ×2 IMPLANT
SHEATH GLIDE SLENDER 4/5FR (SHEATH) ×2 IMPLANT
SHEATH PINNACLE 5F 10CM (SHEATH) ×2 IMPLANT
SYR MEDRAD MARK V 150ML (SYRINGE) ×2 IMPLANT
TRANSDUCER W/STOPCOCK (MISCELLANEOUS) ×2 IMPLANT
TUBING CIL FLEX 10 FLL-RA (TUBING) ×2 IMPLANT
WIRE EMERALD 3MM-J .025X260CM (WIRE) ×2 IMPLANT
WIRE EMERALD 3MM-J .035X150CM (WIRE) ×2 IMPLANT

## 2017-01-14 NOTE — Progress Notes (Signed)
MD on call called back and recommended that we continue to monitor patient. He said that he is watching electrolyte too

## 2017-01-14 NOTE — Progress Notes (Signed)
Pt had 6 beats run of V-tach and asymptomatic. MD has been paged, waiting for response. Will continue to monitor

## 2017-01-14 NOTE — Progress Notes (Signed)
ANTICOAGULATION CONSULT NOTE -follow up Pharmacy Consult for Heparin > apixaban Indication: atrial fibrillation new onset  No Known Allergies  Patient Measurements: Height: 6\' 3"  (190.5 cm) Weight: 181 lb 8 oz (82.3 kg) (scale c) IBW/kg (Calculated) : 84.5 Heparin Dosing Weight: 82.3 kg = TBW  Vital Signs: Temp: 98.2 F (36.8 C) (08/16 0528) Temp Source: Oral (08/16 0528) BP: 101/71 (08/16 1546) Pulse Rate: 102 (08/16 1546)  Labs:  Recent Labs  01/13/17 0938 01/13/17 2019 01/14/17 0404  HGB 16.2  --  15.5  HCT 45.7  --  45.2  PLT 243  --  233  LABPROT  --   --  14.4  INR  --   --  1.11  HEPARINUNFRC 0.15* 0.14* 0.22*  CREATININE 0.93  --  0.85    Estimated Creatinine Clearance: 108.9 mL/min (by C-G formula based on SCr of 0.85 mg/dL).   Assessment: 61yo male on IV heparin drip for atrial afibrillation.  Heparin level was 0.22, subtherapeutic this AM on heparin after rate increased to 1650 units/hr.  Level subtherapeutic but increased toward goal 0.3-0.7 units/ml. Heparin rate was increased ~ 6am today to 1800 units/hr.    Now patient is S/p cardiac cath today,  Sheath removed @ 09:22 Heparin to resume 10 hours after sheath out (NO bolus)  Cath showed no CAD with severely reduced EF of 15%. Echo noted at 25% Continuing IV heparin for now until plan established regarding renal mass.   PM Addendum Stop heparin start Apixaban 5mg  BID - dose appropriate - in preparation for DCCV  Goal of Therapy:  Heparin level 0.3-0.7 units/ml Monitor platelets by anticoagulation protocol: Yes   Plan:  Stop heparin  Apixaban 5mg  BID   Bonnita Nasuti Pharm.D. CPP, BCPS Clinical Pharmacist [phone] 01/14/2017 3:49 PM

## 2017-01-14 NOTE — Progress Notes (Signed)
Patients BP 87/70, HR low 100s high 90s, Patient asymptomatic at this time, family at bedside. NP Mancel Bale paged made aware, awaiting to call back.

## 2017-01-14 NOTE — Progress Notes (Signed)
Site area: rt groin fa sheath 0 Pressure Applied For: 20 minutes Manual:   yes Patient Status During Pull:  stable Post Pull Site:  Level 0 Post Pull Instructions Given:  yes Post Pull Pulses Present: palpable Dressing Applied:  Gauze and tegaderm Bedrest begins @ 0940 Comments:

## 2017-01-14 NOTE — Interval H&P Note (Signed)
Cath Lab Visit (complete for each Cath Lab visit)  Clinical Evaluation Leading to the Procedure:   ACS: No.  Non-ACS:    Anginal Classification: CCS III  Anti-ischemic medical therapy: Minimal Therapy (1 class of medications)  Non-Invasive Test Results: High-risk stress test findings: cardiac mortality >3%/year  Prior CABG: No previous CABG      History and Physical Interval Note:  01/14/2017 7:42 AM  Theodore Weber  has presented today for surgery, with the diagnosis of hf  The various methods of treatment have been discussed with the patient and family. After consideration of risks, benefits and other options for treatment, the patient has consented to  Procedure(s): RIGHT/LEFT HEART CATH AND CORONARY ANGIOGRAPHY (N/A) as a surgical intervention .  The patient's history has been reviewed, patient examined, no change in status, stable for surgery.  I have reviewed the patient's chart and labs.  Questions were answered to the patient's satisfaction.     Shelva Majestic

## 2017-01-14 NOTE — Consult Note (Signed)
Cardiology Consultation:   Patient ID: Theodore Weber; [id number]; 04/13/1956   Admit date: 01/13/2017 Date of Consult: 01/14/2017  Primary Care Provider: Patient, No Pcp Per Primary Cardiologist: New to [facility], Dr. Gwenlyn Found   Patient Profile:   Theodore Weber is a 61 y.o. male with a hx of HTN, regular ETOH, (4 beers per day), a new finding of renal cancer was pending surgery who is being seen today for the evaluation of CM, NSVT, AF at the request of Dr. Gwenlyn Found.  History of Present Illness:   Mr. Rocks initially present to [facility] for elective right nephrectomy, in pre-op found in rapid AFib his surgery cancelled. He was started on diltiazem gtt for rate control, initially not a/c with thoughts of pursuing surgery.  Cardiology was consulted and wu/ revealed a nuclear stress test showing small fixed defects at the apex, no stress-induced ischemia, severe hypokinesis of the left ventricle with EF estimated at 12%. This study was noted to be a high risk study. Echo at [facility] with EF of 40-45%.   He was transferred to [facility] for further evaluation and management on 01/13/17 on heparin gtt, dilt gtt.  LHC this AM revealed normal coronaries, EF 10-15%, TTE here noted EF 20-25% without WMA, and no significant VHD.  He was observed on telemetry to have 27 beats NSVT episode and EP is asked to evaluate further.  His dilt gtt changed to oral amiodarone.  LABS K+ 3.7 BUN/Creat 9/0.85 WBC 6.3 H/H 15/45 plts 233  Past Medical History:  Diagnosis Date  . Atrial fibrillation ([place]) 12/2016  . Hypertension   . Renal mass 12/2016    Past Surgical History:  Procedure Laterality Date  . COLONOSCOPY  2012 ?  Marland Kitchen HYPOSPADIAS CORRECTION     at age 23   . RIGHT/LEFT HEART CATH AND CORONARY ANGIOGRAPHY N/A 01/14/2017   Procedure: RIGHT/LEFT HEART CATH AND CORONARY ANGIOGRAPHY;  Surgeon: Theodore Sine, MD;  Location: [place] [facility];  Service: Cardiovascular;  Laterality: N/A;      Inpatient Medications: Scheduled Meds: . amiodarone  400 mg Oral BID  . aspirin EC  81 mg Oral Daily  . [START ON 01/15/2017] atorvastatin  80 mg Oral Q0600  . carvedilol  3.125 mg Oral BID WC  . feeding supplement (ENSURE ENLIVE)  237 mL Oral BID BM  . sodium chloride flush  3 mL Intravenous Q12H   Continuous Infusions: . sodium chloride 50 mL/hr at 01/14/17 0911  . sodium chloride     PRN Meds: sodium chloride, acetaminophen, diazepam, ondansetron (ZOFRAN) IV, sodium chloride flush  Allergies:   No Known Allergies  Social History:   Social History   Social History  . Marital status: Married    Spouse name: N/A  . Number of children: N/A  . Years of education: N/A   Occupational History  . Not on file.   Social History Main Topics  . Smoking status: Current Every Day Smoker    Types: Cigars  . Smokeless tobacco: Current User  . Alcohol use 1.8 oz/week    3 Cans of beer per week     Comment: daily  . Drug use: No  . Sexual activity: Not on file   Other Topics Concern  . Not on file   Social History Narrative  . No narrative on file    Family History:    Family History  Problem Relation Age of Onset  . Hypertension Father      ROS:  Please  see the history of present illness.  ROS  All other ROS reviewed and negative.     Physical Exam/Data:   Vitals:   01/14/17 0935 01/14/17 0940 01/14/17 1011 01/14/17 1015  BP: 100/78 102/68 109/83   Pulse: 65 82 63 63  Resp: 16 (!) 27 16 16   Temp:      TempSrc:      SpO2: 94% 97% 97% 99%  Weight:      Height:        Intake/Output Summary (Last 24 hours) at 01/14/17 1126 Last data filed at 01/14/17 1027  Gross per 24 hour  Intake           512.28 ml  Output              950 ml  Net          -437.72 ml   Filed Weights   01/13/17 0623 01/14/17 0528  Weight: 189 lb 3.2 oz (85.8 kg) 181 lb 8 oz (82.3 kg)   Body mass index is 22.69 kg/m.  General:  Well nourished, well developed, in no acute  distress HEENT: normal Lymph: no adenopathy Neck: no JVD Endocrine:  No thryomegaly Vascular: No carotid bruits Cardiac:  IRRR; soft SM, no gallops or rubs Lungs:  CTA b/l, no wheezing, rhonchi or rales  Abd: soft, nontender  Ext: no edema Musculoskeletal:  No deformities, BUE and BLE strength normal and equal Skin: warm and dry  Neuro:  No gross focal abnormalities noted Psych:  Normal affect   EKG:  The EKG was personally reviewed and demonstrates:   01/14/17 AFib 94bpm, QRS 69ms, QTc 448ms 04/17/14; SR 75bpm, PR 177ms, QRS 12ms, QTc 437 Telemetry:  Telemetry was personally reviewed and demonstrates:   AFib,  2 episodes of NSVT 18 beats accelerated to >250bpm, self terminated, the last 27beats 150bpm  Relevant CV Studies:  01/14/17: LHC, Dr. Claiborne Billings Dilated nonischemic cardiomyopathy with a global ejection fraction of 10-15%. Normal coronary arteries. Normal right heart pressures without evidence for pulmonary hypertension. RECOMMENDATION: Guideline directed medical therapy for severe LV dysfunction with probable plan for  ICD therapy.   01/13/17: TTE Study Conclusions - Left ventricle: The cavity size was mildly dilated. Wall   thickness was normal. Indeterminant diastolic function (atrial   fibrillation). Systolic function was severely reduced. The   estimated ejection fraction was in the range of 20% to 25%.   Diffuse hypokinesis. - Aortic valve: There was no stenosis. - Aorta: Mildly dilated aortic root. Aortic root dimension: 37 mm   (ED). - Mitral valve: There was trivial regurgitation. - Left atrium: The atrium was mildly dilated. - Right ventricle: The cavity size was normal. Systolic function   was mildly reduced. - Right atrium: The atrium was mildly dilated. - Pulmonary arteries: No complete TR doppler jet so unable to   estimate PA systolic pressure. - Inferior vena cava: The vessel was normal in size. The   respirophasic diameter changes were in the normal  range (>= 50%),   consistent with normal central venous pressure. Impressions: - The patient was in atrial fibrillation. Mildly dilated LV with   diffuse hypokinesis, EF 20-25%. Normal RV size with mildly   decreased systolic function. No significant valvular   abnormalities.  Laboratory Data:  Chemistry Recent Labs Lab 01/13/17 0938 01/14/17 0404  NA 136 136  K 3.9 3.7  CL 103 103  CO2 23 24  GLUCOSE 110* 97  BUN 9 9  CREATININE 0.93 0.85  CALCIUM  9.3 9.0  GFRNONAA >60 >60  GFRAA >60 >60  ANIONGAP 10 9     Recent Labs Lab 01/13/17 0938  PROT 6.9  ALBUMIN 3.5  AST 21  ALT 20  ALKPHOS 54  BILITOT 1.3*   Hematology Recent Labs Lab 01/13/17 0938 01/14/17 0404  WBC 6.8 6.3  RBC 4.85 4.74  HGB 16.2 15.5  HCT 45.7 45.2  MCV 94.2 95.4  MCH 33.4 32.7  MCHC 35.4 34.3  RDW 14.3 14.3  PLT 243 233   Cardiac EnzymesNo results for input(s): TROPONINI in the last 168 hours. No results for input(s): TROPIPOC in the last 168 hours.  BNP Recent Labs Lab 01/13/17 0938  BNP 483.1*    DDimer No results for input(s): DDIMER in the last 168 hours.  Radiology/Studies:  X-ray Chest Pa And Lateral Result Date: 01/13/2017 CLINICAL DATA:  Dyspnea with exertion, renal cancer, hypertension, smoker EXAM: CHEST  2 VIEW COMPARISON:  None FINDINGS: Normal heart size, mediastinal contours, and pulmonary vascularity. Lungs clear. No pleural effusion or pneumothorax. Bones unremarkable. IMPRESSION: No acute abnormalities. Electronically Signed   By: Lavonia Dana M.D.   On: 01/13/2017 09:31    Assessment and Plan:   1. New dx of DCM     Unknown etiology, ?ETOH, ?AF     On BB     Exam does not suggest fluid OL     Pending advance heart failure consult  2. NSVT     dilt changed to loading dose of PO amiodarone today  3. New dx of AFib     CHA2DS2Vasc is 2, on heparin gtt (pending possible nephrectomy)  4. Renal cancer     Had been planned for surgery     Urology  pending   Aggressive medical management for his DCM, would consider TEE/DCCV to restore SR.   NSVT w/DCM less worrisome Pending urology, cancer prognosis, and EF response to medical therapy will drive ICD decisions in the future.   Patient understandably anxious about all of these recent and serious medical issues.   Signed, Baldwin Jamaica, PA-C  01/14/2017 11:26 AM   Patient seen and examined.  Nonischemic cardiomyopathy potential. Factors including atrial fibrillation with rapid rate, alcohol,? NOW in sinus rhythm. Also with nonsustained ventricular tachycardia. As noted above, aggressive medical therapy is the most appropriate effort to reduce his risk of sudden cardiac death. Nonsustained ventricular tachycardia worker for overall increased mortality of sudden death mortality discomfort. He has been started on amiodarone for atrial fibrillation; it has little impact on sudden death mortality. It is however reasonable to maintain.   There are no data to support the use of LifeVest in this cohort  We will be glad to see after drug trials to see if LV function improves

## 2017-01-14 NOTE — Progress Notes (Signed)
Progress Note  Patient Name: Theodore Weber Date of Encounter: 01/14/2017  Primary Cardiologist: Reola Calkins Gwenlyn Found)  Subjective   Just came back from cath. No pain, frustrated about circumstances.    Inpatient Medications    Scheduled Meds: . aspirin EC  81 mg Oral Daily  . [START ON 01/15/2017] atorvastatin  80 mg Oral Q0600  . carvedilol  3.125 mg Oral BID WC  . feeding supplement (ENSURE ENLIVE)  237 mL Oral BID BM  . sodium chloride flush  3 mL Intravenous Q12H   Continuous Infusions: . sodium chloride 50 mL/hr at 01/14/17 0911  . sodium chloride    . diltiazem (CARDIZEM) infusion 5 mg/hr (01/14/17 0556)   PRN Meds: sodium chloride, acetaminophen, diazepam, ondansetron (ZOFRAN) IV, sodium chloride flush   Vital Signs    Vitals:   01/14/17 0935 01/14/17 0940 01/14/17 1011 01/14/17 1015  BP: 100/78 102/68 109/83   Pulse: 65 82 63 63  Resp: 16 (!) 27 16 16   Temp:      TempSrc:      SpO2: 94% 97% 97% 99%  Weight:      Height:        Intake/Output Summary (Last 24 hours) at 01/14/17 1040 Last data filed at 01/14/17 1027  Gross per 24 hour  Intake           512.28 ml  Output              950 ml  Net          -437.72 ml   Filed Weights   01/13/17 0623 01/14/17 0528  Weight: 189 lb 3.2 oz (85.8 kg) 181 lb 8 oz (82.3 kg)    Telemetry    AFib, with 27 beat Run of VT - Personally Reviewed  ECG    AFib - Personally Reviewed  Physical Exam   General: Well developed, well nourished, male appearing in no acute distress. Head: Normocephalic, atraumatic.  Neck: Supple without bruits, JVD. Lungs:  Resp regular and unlabored, CTA. Heart: Irreg Irreg, S1, S2, no S3, S4, or murmur; no rub. Abdomen: Soft, non-tender, non-distended with normoactive bowel sounds. No hepatomegaly. No rebound/guarding. No obvious abdominal masses. Extremities: No clubbing, cyanosis, edema. Distal pedal pulses are 2+ bilaterally. Neuro: Alert and oriented X 3. Moves all extremities  spontaneously. Psych: Normal affect.  Labs    Chemistry Recent Labs Lab 01/13/17 0938 01/14/17 0404  NA 136 136  K 3.9 3.7  CL 103 103  CO2 23 24  GLUCOSE 110* 97  BUN 9 9  CREATININE 0.93 0.85  CALCIUM 9.3 9.0  PROT 6.9  --   ALBUMIN 3.5  --   AST 21  --   ALT 20  --   ALKPHOS 54  --   BILITOT 1.3*  --   GFRNONAA >60 >60  GFRAA >60 >60  ANIONGAP 10 9     Hematology Recent Labs Lab 01/13/17 0938 01/14/17 0404  WBC 6.8 6.3  RBC 4.85 4.74  HGB 16.2 15.5  HCT 45.7 45.2  MCV 94.2 95.4  MCH 33.4 32.7  MCHC 35.4 34.3  RDW 14.3 14.3  PLT 243 233    Cardiac EnzymesNo results for input(s): TROPONINI in the last 168 hours. No results for input(s): TROPIPOC in the last 168 hours.   BNP Recent Labs Lab 01/13/17 0938  BNP 483.1*     DDimer No results for input(s): DDIMER in the last 168 hours.    Radiology    X-ray  Chest Pa And Lateral  Result Date: 01/13/2017 CLINICAL DATA:  Dyspnea with exertion, renal cancer, hypertension, smoker EXAM: CHEST  2 VIEW COMPARISON:  None FINDINGS: Normal heart size, mediastinal contours, and pulmonary vascularity. Lungs clear. No pleural effusion or pneumothorax. Bones unremarkable. IMPRESSION: No acute abnormalities. Electronically Signed   By: Lavonia Dana M.D.   On: 01/13/2017 09:31    Cardiac Studies   L/RHC: 01/14/17  Conclusion     There is severe left ventricular systolic dysfunction.   Dilated nonischemic cardiomyopathy with a global ejection fraction of 10-15%.  Normal coronary arteries.  Normal right heart pressures without evidence for pulmonary hypertension.  RECOMMENDATION: Guideline directed medical therapy for severe LV dysfunction with probable plan for  ICD therapy.   Patient Profile     61 y.o. male wit PMH of right renal mass consistent with Ca, HTN and ETOH abuse who presented to [facility] for right nephrectomy and found to have Afib RVR. Transferred to [facility] for further work up.    Assessment & Plan    1. Afib RVR: Remains in Afib, but rate controlled. Asymptomatic. Was started on IV Dilt/heparin prior to transfer. -- stop dilt gtt, will switch to oral Amiodarone 400mg  BID today.  -- continue coreg 3.125mg  BID -- continue IV heparin for now until plan established regarding renal mass.  -- This patients CHA2DS2-VASc Score and unadjusted Ischemic Stroke Rate (% per year) is equal to 2.2 % stroke rate/year from a score of 2   2. NICM: Cath today showed no CAD with severely reduced EF of 15%. Echo noted at 25%. Noted to have runs of VT on telemetry. Does not appear volume overloaded on exam.  -- coreg 3.125mg  BID, and start amiodarone 400mg  BID. Blood pressures soft, will hold on adding ACEi/ARB at this time.  -- will ask Advanced HF, and EP to see  3. Right renal mass: Planned for right nephrectomy while at [facility] and developed Afib RVR. Will ask urology to see here for assistance.   4. ETOH use: Reports long history of ETOH use with multiple drinks a day. Discussed the need for lifestyle changes.    5. HTN: home HCTZ held. Recommendations as above.    Signed, Reino Bellis, NP  01/14/2017, 10:40 AM  Pager # [phone]   Agree with note by Reino Bellis NP-C  Mr. Ballen had right left heart cath today by Dr. Claiborne Billings revealing nonischemic cardiomyopathy with an EF of 10-15%. He is in A. fib with controlled ventricular response on IV diltiazem and heparin as well as low-dose carvedilol. He did have approximately 20 beats of nonsustained ventricular tachycardia yesterday with normal electrolytes. I'm going to discontinue his IV diltiazem, increase his carvedilol and add by mouth amiodarone. The advanced heart failure service and cardiology service to consult. He will probably ultimately require an ICD for primary prevention. In addition, nephrectomy specimen is most likely need to be done [facility] where there  is quick access to cardiology given his  severe LV dysfunction. Ultimately, he'll need to be on a DOAC.    Lorretta Harp, M.D., [place], [facility], [place] [address] [place], [place]  [id number]  [phone] 01/14/2017 11:09 AM

## 2017-01-14 NOTE — Consult Note (Signed)
Advanced Heart Failure Team Consult Note   Primary Physician: Not in system Primary Cardiologist:  Dr. Gwenlyn Found HF: New (Dr. [name])  Reason for Consultation: New systolic CHF  HPI:    Theodore Weber is seen today for evaluation of New systolic CHF at the request of Dr. Gwenlyn Found.   Theodore Weber is a 61 y.o. male with past medical history of R renal mass, HTN, and ETOH.   Pt presented to [facility] with several day history of R flank pain.  Imaging (CT and [place]) revealed R renal mass concerning for malignancy. Planned elective R nephrectomy. Pt found to be in Afib RVR on presentation to OR, so case cancelled. Started on cardizem drip and Heparin. Nuclear stress tested revealed small fixed defects at the apex, no stress-induced ischemia, and severe LV HK with EF estimated at 12%.    He was transferred to [facility] for further ischemic evaluation and management of his Afib.   Pertinent labs on admission include Creatinine 0.93, K 3.9, BNP 483.1, WBC 6.3.   Pt feeling OK this am. Overwhelmed with multiple consults today. He has had worsening SOB since last winter.  He has no previous history of Afib and from reports, it does not seem he was in Afib on his initial arrival to [facility]. He drinks 4-5 beers daily. He has a long history of heavy drinking. Drinks vodka daily as well. PTA, he had been working 6 days a week. Has been more tired. Denies peripheral edema. No orthopnea or PND. Wife states he does not snore.  Echo 01/13/17 LVEF 20-25%, Trivial MR, Mild LAE, RV mildly reduced, mild RAE,   R/LHC 01/14/2017 Dilated NICM with global ejection fraction of 10-15% Normal coronaries Normal R heart pressures without Pulmonary HTN.  Hemodynamics (mmHg) RA mean 3 RV 27/0 PA 30/16 (22) PCWP 15 AO 98/73 CO/CI 4.5/2.1  Review of Systems: [y] = yes, [ ]  = no   General: Weight gain [ ] ; Weight loss [ ] ; Anorexia [ ] ; Fatigue [y]; Fever [ ] ; Chills [ ] ; Weakness [ ]   Cardiac: Chest  pain/pressure [ ] ; Resting SOB [ ] ; Exertional SOB [y]; Orthopnea [ ] ; Pedal Edema [ ] ; Palpitations [ ] ; Syncope [ ] ; Presyncope [ ] ; Paroxysmal nocturnal dyspnea[ ]   Pulmonary: Cough [ ] ; Wheezing[ ] ; Hemoptysis[ ] ; Sputum [ ] ; Snoring [ ]   GI: Vomiting[ ] ; Dysphagia[ ] ; Melena[ ] ; Hematochezia [ ] ; Heartburn[ ] ; Abdominal pain [ ] ; Constipation [ ] ; Diarrhea [ ] ; BRBPR [ ]   GU: Hematuria[ ] ; Dysuria [ ] ; Nocturia[ ]   Vascular: Pain in legs with walking [ ] ; Pain in feet with lying flat [ ] ; Non-healing sores [ ] ; Stroke [ ] ; TIA [ ] ; Slurred speech [ ] ;  Neuro: Headaches[ ] ; Vertigo[ ] ; Seizures[ ] ; Paresthesias[ ] ;Blurred vision [ ] ; Diplopia [ ] ; Vision changes [ ]   Ortho/Skin: Arthritis [y]; Joint pain [y]; Muscle pain [ ] ; Joint swelling [ ] ; Back Pain [ ] ; Rash [ ]   Psych: Depression[ ] ; Anxiety[ ]   Heme: Bleeding problems [ ] ; Clotting disorders [ ] ; Anemia [ ]   Endocrine: Diabetes [ ] ; Thyroid dysfunction[ ]   Home Medications Prior to Admission medications   Medication Sig Start Date End Date Taking? Authorizing Provider  amLODipine (NORVASC) 10 MG tablet Take 10 mg by mouth daily.   Yes [provider]  OIL OF [place] PO Take 1 each by mouth See admin instructions. Take 1 dropper full by mouth daily   Yes [provider]  Omega-3 Fatty Acids (FISH OIL PO) Take 1 tablet by mouth daily.   Yes [provider]  OVER THE COUNTER MEDICATION Take 1 scoop by mouth daily. [place] Beets OTC   Yes [provider]  OVER THE COUNTER MEDICATION Take 1 each by mouth See admin instructions. Take 1 dropperfull by mouth daily. C02 Extracted Hemp Tincture 1000 mg   Yes [provider]  oxyCODONE (OXY IR/ROXICODONE) 5 MG immediate release tablet Take 5 mg by mouth every 4 (four) hours as needed for severe pain.   Yes [provider]  [facility] Wort 300 MG TABS Take 300 mg by mouth daily.    Yes [provider]  hydrochlorothiazide  (HYDRODIURIL) 25 MG tablet Take 1 tablet (25 mg total) by mouth daily. Patient not taking: Reported on 01/13/2017 04/17/14   Jeannett Senior, PA-C    Past Medical History: Past Medical History:  Diagnosis Date  . Atrial fibrillation ([place]) 12/2016  . Hypertension   . Renal mass 12/2016    Past Surgical History: Past Surgical History:  Procedure Laterality Date  . COLONOSCOPY  2012 ?  Marland Kitchen HYPOSPADIAS CORRECTION     at age 67   . RIGHT/LEFT HEART CATH AND CORONARY ANGIOGRAPHY N/A 01/14/2017   Procedure: RIGHT/LEFT HEART CATH AND CORONARY ANGIOGRAPHY;  Surgeon: Troy Sine, MD;  Location: [place] [facility];  Service: Cardiovascular;  Laterality: N/A;    Family History: Family History  Problem Relation Age of Onset  . Hypertension Father     Social History: Social History   Social History  . Marital status: Married    Spouse name: N/A  . Number of children: N/A  . Years of education: N/A   Social History Main Topics  . Smoking status: Current Every Day Smoker    Types: Cigars  . Smokeless tobacco: Current User  . Alcohol use 1.8 oz/week    3 Cans of beer per week     Comment: daily  . Drug use: No  . Sexual activity: Not Asked   Other Topics Concern  . None   Social History Narrative  . None    Allergies:  No Known Allergies  Objective:    Vital Signs:   Temp:  [98.2 F (36.8 C)-98.9 F (37.2 C)] 98.2 F (36.8 C) (08/16 0528) Pulse Rate:  [0-121] 60 (08/16 1116) Resp:  [0-28] 16 (08/16 1015) BP: (92-127)/(60-90) 107/73 (08/16 1116) SpO2:  [0 %-100 %] 99 % (08/16 1015) Weight:  [181 lb 8 oz (82.3 kg)] 181 lb 8 oz (82.3 kg) (08/16 0528) Last BM Date: 01/13/17  Weight change: Filed Weights   01/13/17 0623 01/14/17 0528  Weight: 189 lb 3.2 oz (85.8 kg) 181 lb 8 oz (82.3 kg)    Intake/Output:   Intake/Output Summary (Last 24 hours) at 01/14/17 1415 Last data filed at 01/14/17 1343  Gross per 24 hour  Intake              480 ml  Output               450 ml  Net               30 ml      Physical Exam    General:  Well appearing. No resp difficulty HEENT: normal Neck: supple. JVP 6-7 cm. Carotids 2+ bilat; no bruits. No lymphadenopathy or thyromegaly appreciated. Cor: PMI nondisplaced. Irregularly irregular. No rubs, gallops or murmurs. Lungs: CTAB, normal effort Abdomen:  soft, nontender, nondistended. No hepatosplenomegaly. No bruits or masses. Good bowel sounds. Extremities: no cyanosis, clubbing, rash, edema Neuro: alert & orientedx3, cranial nerves grossly intact. moves all 4 extremities w/o difficulty. Affect pleasant   Telemetry   Personally reviewed, Afib 90-100s  EKG    Personally reviewed, 01/14/17. Afib 94 bpm (On Dilt drip at this time)  Labs   Basic Metabolic Panel:  Recent Labs Lab 01/13/17 0938 01/14/17 0404  NA 136 136  K 3.9 3.7  CL 103 103  CO2 23 24  GLUCOSE 110* 97  BUN 9 9  CREATININE 0.93 0.85  CALCIUM 9.3 9.0  MG 2.0  --     Liver Function Tests:  Recent Labs Lab 01/13/17 0938  AST 21  ALT 20  ALKPHOS 54  BILITOT 1.3*  PROT 6.9  ALBUMIN 3.5   No results for input(s): LIPASE, AMYLASE in the last 168 hours. No results for input(s): AMMONIA in the last 168 hours.  CBC:  Recent Labs Lab 01/13/17 0938 01/14/17 0404  WBC 6.8 6.3  NEUTROABS 5.1  --   HGB 16.2 15.5  HCT 45.7 45.2  MCV 94.2 95.4  PLT 243 233    Cardiac Enzymes: No results for input(s): CKTOTAL, CKMB, CKMBINDEX, TROPONINI in the last 168 hours.  BNP: BNP (last 3 results)  Recent Labs  01/13/17 0938  BNP 483.1*    ProBNP (last 3 results) No results for input(s): PROBNP in the last 8760 hours.   CBG: No results for input(s): GLUCAP in the last 168 hours.  Coagulation Studies:  Recent Labs  01/14/17 0404  LABPROT 14.4  INR 1.11     Imaging    No results found.   Medications:     Current Medications: . amiodarone  400 mg Oral BID  . aspirin EC  81 mg Oral Daily  . [START  ON 01/15/2017] atorvastatin  80 mg Oral Q0600  . carvedilol  3.125 mg Oral BID WC  . feeding supplement (ENSURE ENLIVE)  237 mL Oral BID BM  . sodium chloride flush  3 mL Intravenous Q12H     Infusions: . sodium chloride 50 mL/hr at 01/14/17 0911  . sodium chloride    . heparin         Patient Profile   61 y.o. male wit PMH of right renal mass consistent with Ca, HTN and ETOH abuse who presented to [facility] for right nephrectomy and found to have Afib RVR. Transferred to [facility] for further work up.   Assessment/Plan   1. Acute systolic CHF due to NICM - Echo 01/13/17 LVEF 20-25%, Trivial MR, Mild LAE, RV mildly reduced, mild RAE,  - R/LHC this am with normal coronaries and filling pressures.  - Continue coreg 3.125 mg BID - Consider low dose Losartan. - Pressures soft, will have to up-titrate medications carefully.  - He is NOT a candidate for any advanced therapies or transplant with R renal mass. This would have to be worked up fully prior to consideration of any kind.  - EP has seen to discuss ICD consideration.  - Pt has very heavy ETOH use. Likely contributor to his CMP. - Will plan for cardiac MRI to look for infiltrative disease and other causes of NICM. 2. Afib RVR  - CHA2DS2/VASc at least 2.  - Needs TEE/DCCV to restore SR. Will plan tentatively for tomorrow.  3. NSVT - Diltiazem stopped with systolic CHF - Continue po amiodarone. Appreciate EP input.  4. R Renal mass -  Had been scheduled for R nephrectomy - Urology consult pending.  5. Alcohol Abuse - Strongly encouraged cessation. Pt has had 5-8 drinks per day for years [facility] + Vodka)  Length of Stay: 1  Annamaria Helling  01/14/2017, 2:15 PM  Advanced Heart Failure Team Pager [phone] (M-F; 7a - 4p)  Please contact [facility] for night-coverage after hours (4p -7a ) and weekends on [email or web address]  Patient seen and examined with the above-signed Advanced Practice Provider and/or  Housestaff. I personally reviewed laboratory data, imaging studies and relevant notes. I independently examined the patient and formulated the important aspects of the plan. I have edited the note to reflect any of my changes or salient points. I have personally discussed the plan with the patient and/or family.  He has severe biventricular dysfunction due to NICM. Suspect likely cause is ETOH but AF also a possibility - though duration may not be long enough. Cath films and hemodynamics reviewed personally. Volume status ok. CO marginal  Would stop carvedilol. Start dig, spiro and losartan as BP tolerates. Will get cMRI. Load amio plan TEE/DC-CV. Start NOAC. Hold off on surgery for now. Check HIV and hepatitis serologies.   Glori Bickers, MD  3:13 PM

## 2017-01-14 NOTE — Progress Notes (Signed)
Spoke with NP, aware, stated to monitor patient for now, if BP still low tonight to hold Losartan. Will monitor accordingly.

## 2017-01-14 NOTE — Progress Notes (Signed)
Received port Cath, alert and oriented. Right AC dressing CDI, Right groin gauge dressing CDI, no bleeding, nohematoma or no  swelling noted. Will monitor accordingly.

## 2017-01-14 NOTE — Progress Notes (Signed)
Site area: rt ac venous sheath Site Prior to Removal:  Level 0 Pressure Applied For: 10 minutes Manual:   yes Patient Status During Pull:  stable Post Pull Site:  Level 0 Post Pull Instructions Given:  yes Post Pull Pulses Present: rt radial 2+ Dressing Applied:  Gauze and tegaderm Bedrest begins @  Comments:   

## 2017-01-14 NOTE — Progress Notes (Signed)
Amiodarone drip started, HR 107, v/s checked ( see flowsheet).  Right brachial and Right femoral dressing CDI, Cath site level 0. Will monitor accordingly.

## 2017-01-14 NOTE — Consult Note (Signed)
Urology Consult  Referring physician: Dr. Gwenlyn Found Reason for referral: Right renal mass  Chief Complaint: Right renal mass  History of Present Illness: Theodore Weber is a 61yo with a hx of CHF, Afib and right renal mass transferred to [facility] from [facility] with new onset Afib and CHF. He was scheduled for open right radical nephrectomy yesterday but this was cancelled due to new cardiac issues. His EF on admission was 12% and he was on a cardizem drip.  The right renal mass is 6cm and concerning for malignancy. He denies any gross hematuria. No Lower urinary tract symptoms. No bone pain. No flank pain. He does have worsening shortness of breath  Past Medical History:  Diagnosis Date  . Atrial fibrillation ([place]) 12/2016  . Hypertension   . Renal mass 12/2016   Past Surgical History:  Procedure Laterality Date  . COLONOSCOPY  2012 ?  Marland Kitchen HYPOSPADIAS CORRECTION     at age 71   . RIGHT/LEFT HEART CATH AND CORONARY ANGIOGRAPHY N/A 01/14/2017   Procedure: RIGHT/LEFT HEART CATH AND CORONARY ANGIOGRAPHY;  Surgeon: Troy Sine, MD;  Location: [place] [facility];  Service: Cardiovascular;  Laterality: N/A;    Medications: I have reviewed the patient's current medications. Allergies: No Known Allergies  Family History  Problem Relation Age of Onset  . Hypertension Father    Social History:  reports that he has been smoking Cigars.  He uses smokeless tobacco. He reports that he drinks about 1.8 oz of alcohol per week . He reports that he does not use drugs.  Review of Systems  Constitutional: Positive for malaise/fatigue.  Respiratory: Positive for shortness of breath.   Neurological: Positive for weakness.  All other systems reviewed and are negative.   Physical Exam:  Vital signs in last 24 hours: Temp:  [98 F (36.7 C)-98.2 F (36.8 C)] 98 F (36.7 C) (08/16 2114) Pulse Rate:  [0-121] 100 (08/16 2114) Resp:  [0-28] 18 (08/16 2114) BP: (87-127)/(60-90) 95/75 (08/16  2114) SpO2:  [0 %-100 %] 98 % (08/16 2114) Weight:  [82.3 kg (181 lb 8 oz)] 82.3 kg (181 lb 8 oz) (08/16 0528) Physical Exam  Constitutional: He is oriented to person, place, and time. He appears well-developed and well-nourished.  HENT:  Head: Normocephalic and atraumatic.  Eyes: Pupils are equal, round, and reactive to light. EOM are normal.  Neck: Normal range of motion. No thyromegaly present.  Cardiovascular: Normal rate and regular rhythm.   Respiratory: Effort normal. No respiratory distress.  GI: Soft. He exhibits no distension.  Genitourinary: Testes normal. Hypospadias present.  Musculoskeletal: Normal range of motion. He exhibits no edema.  Neurological: He is alert and oriented to person, place, and time.  Skin: Skin is warm and dry.  Psychiatric: He has a normal mood and affect. His behavior is normal. Judgment and thought content normal.    Laboratory Data:  Results for orders placed or performed during the hospital encounter of 01/13/17 (from the past 72 hour(s))  HIV antibody (Routine Testing)     Status: None   Collection Time: 01/13/17  9:38 AM  Result Value Ref Range   HIV Screen 4th Generation wRfx Non Reactive Non Reactive    Comment: (NOTE) Performed At: [facility] [place], [place] [id number] Lindon Romp MD [id number]   Comprehensive metabolic panel     Status: Abnormal   Collection Time: 01/13/17  9:38 AM  Result Value Ref Range   Sodium 136 135 -  145 mmol/L   Potassium 3.9 3.5 - 5.1 mmol/L   Chloride 103 101 - 111 mmol/L   CO2 23 22 - 32 mmol/L   Glucose, Bld 110 (H) 65 - 99 mg/dL   BUN 9 6 - 20 mg/dL   Creatinine, Ser 0.93 0.61 - 1.24 mg/dL   Calcium 9.3 8.9 - 10.3 mg/dL   Total Protein 6.9 6.5 - 8.1 g/dL   Albumin 3.5 3.5 - 5.0 g/dL   AST 21 15 - 41 U/L   ALT 20 17 - 63 U/L   Alkaline Phosphatase 54 38 - 126 U/L   Total Bilirubin 1.3 (H) 0.3 - 1.2 mg/dL   GFR calc non Af Amer >60 >60 mL/min   GFR calc Af Amer  >60 >60 mL/min    Comment: (NOTE) The eGFR has been calculated using the CKD EPI equation. This calculation has not been validated in all clinical situations. eGFR's persistently <60 mL/min signify possible Chronic Kidney Disease.    Anion gap 10 5 - 15  Magnesium     Status: None   Collection Time: 01/13/17  9:38 AM  Result Value Ref Range   Magnesium 2.0 1.7 - 2.4 mg/dL  TSH     Status: Abnormal   Collection Time: 01/13/17  9:38 AM  Result Value Ref Range   TSH 7.265 (H) 0.350 - 4.500 uIU/mL    Comment: Performed by a 3rd Generation assay with a functional sensitivity of <=0.01 uIU/mL.  Hemoglobin A1c     Status: None   Collection Time: 01/13/17  9:38 AM  Result Value Ref Range   Hgb A1c MFr Bld 5.2 4.8 - 5.6 %    Comment: (NOTE)         Prediabetes: 5.7 - 6.4         Diabetes: >6.4         Glycemic control for adults with diabetes: <7.0    Mean Plasma Glucose 103 mg/dL    Comment: (NOTE) Performed At: [facility] [place], [place] [id number] Lindon Romp MD [id number]   Brain natriuretic peptide     Status: Abnormal   Collection Time: 01/13/17  9:38 AM  Result Value Ref Range   B Natriuretic Peptide 483.1 (H) 0.0 - 100.0 pg/mL  CBC WITH DIFFERENTIAL     Status: None   Collection Time: 01/13/17  9:38 AM  Result Value Ref Range   WBC 6.8 4.0 - 10.5 K/uL   RBC 4.85 4.22 - 5.81 MIL/uL   Hemoglobin 16.2 13.0 - 17.0 g/dL   HCT 45.7 39.0 - 52.0 %   MCV 94.2 78.0 - 100.0 fL   MCH 33.4 26.0 - 34.0 pg   MCHC 35.4 30.0 - 36.0 g/dL   RDW 14.3 11.5 - 15.5 %   Platelets 243 150 - 400 K/uL   Neutrophils Relative % 74 %   Neutro Abs 5.1 1.7 - 7.7 K/uL   Lymphocytes Relative 17 %   Lymphs Abs 1.2 0.7 - 4.0 K/uL   Monocytes Relative 7 %   Monocytes Absolute 0.5 0.1 - 1.0 K/uL   Eosinophils Relative 1 %   Eosinophils Absolute 0.1 0.0 - 0.7 K/uL   Basophils Relative 1 %   Basophils Absolute 0.0 0.0 - 0.1 K/uL  Lipid panel     Status: None    Collection Time: 01/13/17  9:38 AM  Result Value Ref Range   Cholesterol 161 0 - 200 mg/dL   Triglycerides 56 <150 mg/dL  HDL 64 >40 mg/dL   Total CHOL/HDL Ratio 2.5 RATIO   VLDL 11 0 - 40 mg/dL   LDL Cholesterol 86 0 - 99 mg/dL    Comment:        Total Cholesterol/HDL:CHD Risk Coronary Heart Disease Risk Table                     Men   Women  1/2 Average Risk   3.4   3.3  Average Risk       5.0   4.4  2 X Average Risk   9.6   7.1  3 X Average Risk  23.4   11.0        Use the calculated Patient Ratio above and the CHD Risk Table to determine the patient's CHD Risk.        ATP III CLASSIFICATION (LDL):  <100     mg/dL   Optimal  100-129  mg/dL   Near or Above                    Optimal  130-159  mg/dL   Borderline  160-189  mg/dL   High  >190     mg/dL   Very High   Heparin level (unfractionated)     Status: Abnormal   Collection Time: 01/13/17  9:38 AM  Result Value Ref Range   Heparin Unfractionated 0.15 (L) 0.30 - 0.70 IU/mL    Comment:        IF HEPARIN RESULTS ARE BELOW EXPECTED VALUES, AND PATIENT DOSAGE HAS BEEN CONFIRMED, SUGGEST FOLLOW UP TESTING OF ANTITHROMBIN III LEVELS.   Heparin level (unfractionated)     Status: Abnormal   Collection Time: 01/13/17  8:19 PM  Result Value Ref Range   Heparin Unfractionated 0.14 (L) 0.30 - 0.70 IU/mL    Comment:        IF HEPARIN RESULTS ARE BELOW EXPECTED VALUES, AND PATIENT DOSAGE HAS BEEN CONFIRMED, SUGGEST FOLLOW UP TESTING OF ANTITHROMBIN III LEVELS.   Basic metabolic panel     Status: None   Collection Time: 01/14/17  4:04 AM  Result Value Ref Range   Sodium 136 135 - 145 mmol/L   Potassium 3.7 3.5 - 5.1 mmol/L   Chloride 103 101 - 111 mmol/L   CO2 24 22 - 32 mmol/L   Glucose, Bld 97 65 - 99 mg/dL   BUN 9 6 - 20 mg/dL   Creatinine, Ser 0.85 0.61 - 1.24 mg/dL   Calcium 9.0 8.9 - 10.3 mg/dL   GFR calc non Af Amer >60 >60 mL/min   GFR calc Af Amer >60 >60 mL/min    Comment: (NOTE) The eGFR has been  calculated using the CKD EPI equation. This calculation has not been validated in all clinical situations. eGFR's persistently <60 mL/min signify possible Chronic Kidney Disease.    Anion gap 9 5 - 15  CBC     Status: None   Collection Time: 01/14/17  4:04 AM  Result Value Ref Range   WBC 6.3 4.0 - 10.5 K/uL   RBC 4.74 4.22 - 5.81 MIL/uL   Hemoglobin 15.5 13.0 - 17.0 g/dL   HCT 45.2 39.0 - 52.0 %   MCV 95.4 78.0 - 100.0 fL   MCH 32.7 26.0 - 34.0 pg   MCHC 34.3 30.0 - 36.0 g/dL   RDW 14.3 11.5 - 15.5 %   Platelets 233 150 - 400 K/uL  Heparin level (unfractionated)  Status: Abnormal   Collection Time: 01/14/17  4:04 AM  Result Value Ref Range   Heparin Unfractionated 0.22 (L) 0.30 - 0.70 IU/mL    Comment:        IF HEPARIN RESULTS ARE BELOW EXPECTED VALUES, AND PATIENT DOSAGE HAS BEEN CONFIRMED, SUGGEST FOLLOW UP TESTING OF ANTITHROMBIN III LEVELS.   Protime-INR     Status: None   Collection Time: 01/14/17  4:04 AM  Result Value Ref Range   Prothrombin Time 14.4 11.4 - 15.2 seconds   INR 1.11   I-STAT 3, venous blood gas (G3P V)     Status: Abnormal   Collection Time: 01/14/17  8:13 AM  Result Value Ref Range   pH, Ven 7.401 7.250 - 7.430   pCO2, Ven 39.2 (L) 44.0 - 60.0 mmHg   pO2, Ven 36.0 32.0 - 45.0 mmHg   Bicarbonate 24.3 20.0 - 28.0 mmol/L   TCO2 26 0 - 100 mmol/L   O2 Saturation 69.0 %   Patient temperature HIDE    Sample type VENOUS    Comment NOTIFIED PHYSICIAN   I-STAT 3, venous blood gas (G3P V)     Status: Abnormal   Collection Time: 01/14/17  8:17 AM  Result Value Ref Range   pH, Ven 7.394 7.250 - 7.430   pCO2, Ven 40.6 (L) 44.0 - 60.0 mmHg   pO2, Ven 36.0 32.0 - 45.0 mmHg   Bicarbonate 24.8 20.0 - 28.0 mmol/L   TCO2 26 0 - 100 mmol/L   O2 Saturation 69.0 %   Patient temperature HIDE    Sample type VENOUS    Comment NOTIFIED PHYSICIAN   I-STAT 3, arterial blood gas (G3+)     Status: None   Collection Time: 01/14/17  8:50 AM  Result Value Ref  Range   pH, Arterial 7.436 7.350 - 7.450   pCO2 arterial 34.1 32.0 - 48.0 mmHg   pO2, Arterial 97.0 83.0 - 108.0 mmHg   Bicarbonate 22.9 20.0 - 28.0 mmol/L   TCO2 24 0 - 100 mmol/L   O2 Saturation 98.0 %   Acid-base deficit 1.0 0.0 - 2.0 mmol/L   Patient temperature HIDE    Sample type ARTERIAL   POCT Activated clotting time     Status: None   Collection Time: 01/14/17  8:52 AM  Result Value Ref Range   Activated Clotting Time 109 seconds   No results found for this or any previous visit (from the past 240 hour(s)). Creatinine:  Recent Labs  01/13/17 0938 01/14/17 0404  CREATININE 0.93 0.85   Baseline Creatinine: 0.9  Impression/Assessment:  60yo with a right renal mass concerning for malignnacy  Plan:  1. I discussed the management of large renal masses including open versus robotic partial and radical nephrectomy. His renal mass is not amenable to partial nephrectomy. We discussed robotic versus open nephrectomy and the outcomes of both treatments. The patient wishes to proceed with surgery but he will need cardiac  optimization prior to surgery. I discussed tentatively scheduling him for mid-end of Sept pending cardiology clearance.  Nicolette Bang 01/14/2017, 9:49 PM

## 2017-01-14 NOTE — Progress Notes (Addendum)
Initial Nutrition Assessment  DOCUMENTATION CODES:   Non-severe (moderate) malnutrition in context of chronic illness  INTERVENTION:    Continue Ensure Enlive po BID, each supplement provides 350 kcal and 20 grams of protein  NUTRITION DIAGNOSIS:   Malnutrition (moderate) related to catabolic illness (renal cancer, cardiomyopathy ) as evidenced by moderate depletion of body fat, moderate depletions of muscle mass  GOAL:   Patient will meet greater than or equal to 90% of their needs  MONITOR:   PO intake, Supplement acceptance, Labs, Weight trends, Skin, I & O's  REASON FOR ASSESSMENT:   Consult Assessment of nutrition requirement/status  ASSESSMENT:   61 yo Male who initially presented to [facility] for elective right nephrectomy, in pre-op found in rapid AFib his surgery cancelled. He was started on diltiazem gtt for rate control, initially not a/c with thoughts of pursuing surgery.  Cardiology was consulted and wu/ revealed a nuclear stress test showing small fixed defects at the apex, no stress-induced ischemia, severe hypokinesis of the left ventricle with EF estimated at 12%. This study was noted to be a high risk study. Echo at [facility] with EF of 40-45%.   RD spoke with pt at bedside. Wife present. Pt reports a good appetite. Ate all of his lunch today. Has Ensure Enlive ordered. Had one today and he enjoyed it. Wife reveals pt has lost about 12 lbs. Time frame unknown. Labs and medications reviewed.  Nutrition-Focused physical exam completed. Findings are moderate muscle fat depletion, moderate muscle depletion, and no edema.   Diet Order:  Diet Heart Room service appropriate? Yes; Fluid consistency: Thin  Skin:  Reviewed, no issues  Last BM:  8/15  Height:   Ht Readings from Last 1 Encounters:  01/13/17 6\' 3"  (1.905 m)   Weight:   Wt Readings from Last 1 Encounters:  01/14/17 181 lb 8 oz (82.3 kg)   Ideal Body Weight:  89 kg  BMI:  Body mass  index is 22.69 kg/m.  Estimated Nutritional Needs:   Kcal:  2200-2400  Protein:  110-125 gm  Fluid:  2.2-2.4 L  EDUCATION NEEDS:   No education needs identified at this time  Arthur Holms, RD, LDN Pager #: [phone] After-Hours Pager #: [phone]

## 2017-01-14 NOTE — Progress Notes (Signed)
ANTICOAGULATION CONSULT NOTE -follow up Pharmacy Consult for Heparin Indication: atrial fibrillation new onset  No Known Allergies  Patient Measurements: Height: 6\' 3"  (190.5 cm) Weight: 181 lb 8 oz (82.3 kg) (scale c) IBW/kg (Calculated) : 84.5 Heparin Dosing Weight: 82.3 kg = TBW  Vital Signs: Temp: 98.2 F (36.8 C) (08/16 0528) Temp Source: Oral (08/16 0528) BP: 107/73 (08/16 1116) Pulse Rate: 60 (08/16 1116)  Labs:  Recent Labs  01/13/17 0938 01/13/17 2019 01/14/17 0404  HGB 16.2  --  15.5  HCT 45.7  --  45.2  PLT 243  --  233  LABPROT  --   --  14.4  INR  --   --  1.11  HEPARINUNFRC 0.15* 0.14* 0.22*  CREATININE 0.93  --  0.85    Estimated Creatinine Clearance: 108.9 mL/min (by C-G formula based on SCr of 0.85 mg/dL).   Assessment: 62yo male on IV heparin drip for atrial afibrillation.  Heparin level was 0.22, subtherapeutic this AM on heparin after rate increased to 1650 units/hr.  Level subtherapeutic but increased toward goal 0.3-0.7 units/ml. Heparin rate was increased ~ 6am today to 1800 units/hr.    Now patient is S/p cardiac cath today,  Sheath removed @ 09:22 Heparin to resume 10 hours after sheath out (NO bolus)  Cath showed no CAD with severely reduced EF of 15%. Echo noted at 25% Continuing IV heparin for now until plan established regarding renal mass.    Goal of Therapy:  Heparin level 0.3-0.7 units/ml Monitor platelets by anticoagulation protocol: Yes   Plan:  Tonight at 20:00 restart heparin drip at 1800 units/hr, no bolus. Check heparin level in 8hr. Daily HL, CBC while on IV heparin  F/u on plan regarding renal mass and transition to oral anticoagulation   Thank you for allowing pharmacy to be part of this patients care team. Nicole Cella, RPh Clinical Pharmacist Pager: [phone] 8A-4P [phone] 4P-10P [phone] [place] [phone] 01/14/2017,2:05 PM

## 2017-01-14 NOTE — Progress Notes (Signed)
ANTICOAGULATION CONSULT NOTE - Follow Up Consult  Pharmacy Consult for heparin Indication: atrial fibrillation  Labs:  Recent Labs  01/13/17 0938 01/13/17 2019 01/14/17 0404  HGB 16.2  --  15.5  HCT 45.7  --  45.2  PLT 243  --  233  LABPROT  --   --  14.4  INR  --   --  1.11  HEPARINUNFRC 0.15* 0.14* 0.22*  CREATININE 0.93  --  0.85    Assessment: 61yo male remains subtherapeutic on heparin after rate change though closer to goal.  Goal of Therapy:  Heparin level 0.3-0.7 units/ml   Plan:  Will increase heparin gtt by 2 units/kg/hr to 1800 units/hr and check level in 6hr.  Wynona Neat, PharmD, BCPS  01/14/2017,5:59 AM

## 2017-01-15 ENCOUNTER — Encounter (HOSPITAL_COMMUNITY)
Admission: AD | Disposition: A | Discharge status: Home / Self Care | Payer: Self-pay | Source: Other Acute Inpatient Hospital | Attending: Cardiovascular Disease

## 2017-01-15 ENCOUNTER — Inpatient Hospital Stay (HOSPITAL_COMMUNITY): Payer: 59 | Admitting: Certified Registered Nurse Anesthetist

## 2017-01-15 ENCOUNTER — Encounter (HOSPITAL_COMMUNITY): Payer: Self-pay | Admitting: Certified Registered Nurse Anesthetist

## 2017-01-15 ENCOUNTER — Inpatient Hospital Stay (HOSPITAL_COMMUNITY): Payer: 59

## 2017-01-15 DIAGNOSIS — I4891 Unspecified atrial fibrillation: Secondary | ICD-10-CM

## 2017-01-15 DIAGNOSIS — I429 Cardiomyopathy, unspecified: Secondary | ICD-10-CM

## 2017-01-15 DIAGNOSIS — I5023 Acute on chronic systolic (congestive) heart failure: Secondary | ICD-10-CM

## 2017-01-15 HISTORY — PX: TEE WITHOUT CARDIOVERSION: SHX5443

## 2017-01-15 HISTORY — PX: CARDIOVERSION: SHX1299

## 2017-01-15 HISTORY — DX: Acute on chronic systolic (congestive) heart failure: I50.23

## 2017-01-15 LAB — CBC
HCT: 45.8 % (ref 39.0–52.0)
Hemoglobin: 15.4 g/dL (ref 13.0–17.0)
MCH: 32.4 pg (ref 26.0–34.0)
MCHC: 33.6 g/dL (ref 30.0–36.0)
MCV: 96.2 fL (ref 78.0–100.0)
Platelets: 247 10*3/uL (ref 150–400)
RBC: 4.76 MIL/uL (ref 4.22–5.81)
RDW: 14.4 % (ref 11.5–15.5)
WBC: 5.8 10*3/uL (ref 4.0–10.5)

## 2017-01-15 LAB — IRON AND TIBC
Iron: 44 ug/dL — ABNORMAL LOW (ref 45–182)
Saturation Ratios: 13 % — ABNORMAL LOW (ref 17.9–39.5)
TIBC: 339 ug/dL (ref 250–450)
UIBC: 295 ug/dL

## 2017-01-15 LAB — BASIC METABOLIC PANEL
Anion gap: 8 (ref 5–15)
BUN: 12 mg/dL (ref 6–20)
CO2: 25 mmol/L (ref 22–32)
Calcium: 9.1 mg/dL (ref 8.9–10.3)
Chloride: 102 mmol/L (ref 101–111)
Creatinine, Ser: 0.95 mg/dL (ref 0.61–1.24)
GFR calc Af Amer: >60 mL/min (ref >60)
GFR calc non Af Amer: >60 mL/min (ref >60)
Glucose, Bld: 114 mg/dL — ABNORMAL HIGH (ref 65–99)
Potassium: 4.1 mmol/L (ref 3.5–5.1)
Sodium: 135 mmol/L (ref 135–145)

## 2017-01-15 LAB — MAGNESIUM: Magnesium: 2 mg/dL (ref 1.7–2.4)

## 2017-01-15 LAB — RETICULOCYTES
RBC.: 4.76 MIL/uL (ref 4.22–5.81)
Retic Count, Absolute: 42.8 10*3/uL (ref 19.0–186.0)
Retic Ct Pct: 0.9 % (ref 0.4–3.1)

## 2017-01-15 LAB — FOLATE: Folate: 9.1 ng/mL (ref >5.9)

## 2017-01-15 LAB — HEPATITIS PANEL, ACUTE
HCV Ab: <0.1 s/co ratio (ref 0.0–0.9)
Hep A IgM: NEGATIVE
Hep B C IgM: NEGATIVE
Hepatitis B Surface Ag: NEGATIVE

## 2017-01-15 LAB — VITAMIN B12: Vitamin B-12: 579 pg/mL (ref 180–914)

## 2017-01-15 LAB — FERRITIN: Ferritin: 153 ng/mL (ref 24–336)

## 2017-01-15 SURGERY — ECHOCARDIOGRAM, TRANSESOPHAGEAL
Anesthesia: Monitor Anesthesia Care

## 2017-01-15 MED ORDER — SODIUM CHLORIDE 0.9% FLUSH
3.0000 mL | Freq: Two times a day (BID) | INTRAVENOUS | Status: DC
  Administered 2017-01-15 – 2017-01-16 (×2): 3 mL via INTRAVENOUS

## 2017-01-15 MED ORDER — ETOMIDATE 2 MG/ML IV SOLN
INTRAVENOUS | Status: DC | PRN
  Administered 2017-01-15: 10 mg via INTRAVENOUS
  Administered 2017-01-15: 6 mg via INTRAVENOUS
  Administered 2017-01-15 (×2): 2 mg via INTRAVENOUS

## 2017-01-15 MED ORDER — LIDOCAINE VISCOUS 2 % MT SOLN
OROMUCOSAL | Status: DC | PRN
  Administered 2017-01-15: 10 mL via OROMUCOSAL

## 2017-01-15 MED ORDER — SPIRONOLACTONE 25 MG PO TABS
25.0000 mg | ORAL_TABLET | Freq: Every day | ORAL | Status: DC
  Administered 2017-01-16: 25 mg via ORAL
  Filled 2017-01-15: qty 1

## 2017-01-15 MED ORDER — PROPOFOL 10 MG/ML IV BOLUS
INTRAVENOUS | Status: DC | PRN
  Administered 2017-01-15 (×2): 20 mg via INTRAVENOUS
  Administered 2017-01-15: 10 mg via INTRAVENOUS
  Administered 2017-01-15: 20 mg via INTRAVENOUS
  Administered 2017-01-15: 10 mg via INTRAVENOUS
  Administered 2017-01-15: 20 mg via INTRAVENOUS
  Administered 2017-01-15: 10 mg via INTRAVENOUS

## 2017-01-15 MED ORDER — SODIUM CHLORIDE 0.9 % IV SOLN: 250.0000 mL | INTRAVENOUS | Status: DC

## 2017-01-15 MED ORDER — LIDOCAINE VISCOUS 2 % MT SOLN
OROMUCOSAL | Status: AC
  Filled 2017-01-15: qty 15

## 2017-01-15 MED ORDER — FENTANYL CITRATE (PF) 100 MCG/2ML IJ SOLN: 25.0000 ug | INTRAMUSCULAR | Status: DC | PRN

## 2017-01-15 MED ORDER — ONDANSETRON HCL 4 MG/2ML IJ SOLN: 4.0000 mg | Freq: Once | INTRAMUSCULAR | Status: DC | PRN

## 2017-01-15 MED ORDER — SODIUM CHLORIDE 0.9% FLUSH: 3.0000 mL | INTRAVENOUS | Status: DC | PRN

## 2017-01-15 MED ORDER — GADOBENATE DIMEGLUMINE 529 MG/ML IV SOLN
30.0000 mL | Freq: Once | INTRAVENOUS | Status: AC
  Administered 2017-01-15: 27 mL via INTRAVENOUS

## 2017-01-15 MED ORDER — LACTATED RINGERS IV SOLN
INTRAVENOUS | Status: DC | PRN
  Administered 2017-01-15: 08:00:00 via INTRAVENOUS

## 2017-01-15 MED FILL — Verapamil HCl IV Soln 2.5 MG/ML: INTRAVENOUS | Qty: 2 | Status: AC

## 2017-01-15 NOTE — Progress Notes (Signed)
Pt had 10-beat runs of V-Tach. MD on call has been paged, waiting for response

## 2017-01-15 NOTE — Anesthesia Preprocedure Evaluation (Addendum)
Anesthesia Evaluation  Patient identified by MRN, date of birth, ID band Patient awake    Reviewed: Allergy & Precautions, NPO status , Patient's Chart, lab work & pertinent test results, reviewed documented beta blocker date and time   Airway Mallampati: II  TM Distance: >3 FB Neck ROM: Full    Dental  (+) Teeth Intact, Dental Advisory Given   Pulmonary Current Smoker,    Pulmonary exam normal breath sounds clear to auscultation       Cardiovascular hypertension, Pt. on medications and Pt. on home beta blockers +CHF  + dysrhythmias (amiodarone) Atrial Fibrillation  Rhythm:Irregular Rate:Tachycardia  Echo 01/13/17: Study Conclusions  - Left ventricle: The cavity size was mildly dilated. Wall   thickness was normal. Indeterminant diastolic function (atrial fibrillation). Systolic function was severely reduced. The estimated ejection fraction was in the range of 20% to 25%. Diffuse hypokinesis. - Aortic valve: There was no stenosis. - Aorta: Mildly dilated aortic root. Aortic root dimension: 37 mm   (ED). - Mitral valve: There was trivial regurgitation. - Left atrium: The atrium was mildly dilated. - Right ventricle: The cavity size was normal. Systolic function was mildly reduced. - Right atrium: The atrium was mildly dilated. - Pulmonary arteries: No complete TR doppler jet so unable to estimate PA systolic pressure. - Inferior vena cava: The vessel was normal in size. The   respirophasic diameter changes were in the normal range (>= 50%), consistent with normal central venous pressure.  Impressions:  - The patient was in atrial fibrillation. Mildly dilated LV with   diffuse hypokinesis, EF 20-25%. Normal RV size with mildly decreased systolic function. No significant valvular   abnormalities.   Neuro/Psych negative neurological ROS     GI/Hepatic negative GI ROS, (+)     substance abuse  alcohol use,   Endo/Other   negative endocrine ROS  Renal/GU negative Renal ROSRenal mass     Musculoskeletal negative musculoskeletal ROS (+)   Abdominal   Peds  Hematology  (+) Blood dyscrasia (Eliquis), ,   Anesthesia Other Findings Day of surgery medications reviewed with the patient.  Reproductive/Obstetrics                            Anesthesia Physical Anesthesia Plan  ASA: IV  Anesthesia Plan: MAC   Post-op Pain Management:    Induction: Intravenous  PONV Risk Score and Plan: 0  Airway Management Planned: Nasal Cannula  Additional Equipment:   Intra-op Plan:   Post-operative Plan:   Informed Consent: I have reviewed the patients History and Physical, chart, labs and discussed the procedure including the risks, benefits and alternatives for the proposed anesthesia with the patient or authorized representative who has indicated his/her understanding and acceptance.   Dental advisory given  Plan Discussed with: CRNA and Anesthesiologist  Anesthesia Plan Comments: (Discussed risks/benefits/alternatives to MAC sedation including need for ventilatory support, hypotension, need for conversion to general anesthesia.  All patient questions answered.  Patient/guardian wishes to proceed.)        Anesthesia Quick Evaluation

## 2017-01-15 NOTE — CV Procedure (Signed)
-    TRANSESOPHAGEAL ECHOCARDIOGRAM GUIDED DIRECT CURRENT CARDIOVERSION  NAME:  Theodore Weber   MRN: [id number] DOB:  [birth year]   ADMIT DATE: 01/13/2017  INDICATIONS: Atrial fibrillation   PROCEDURE:   Informed consent was obtained prior to the procedure. The risks, benefits and alternatives for the procedure were discussed and the patient comprehended these risks.  Risks include, but are not limited to, cough, sore throat, vomiting, nausea, somnolence, esophageal and stomach trauma or perforation, bleeding, low blood pressure, aspiration, pneumonia, infection, trauma to the teeth and death.    After a procedural time-out, the oropharynx was anesthetized and the patient was sedated by the anesthesia service. The transesophageal probe was inserted in the esophagus and stomach without difficulty and multiple views were obtained.   FINDINGS:  LEFT VENTRICLE: Dilated EF = 15%. Severe global HK   RIGHT VENTRICLE: Severe global HK   LEFT ATRIUM: Dilated  LEFT ATRIAL APPENDAGE: + smoke. No organized clot  RIGHT ATRIUM: Dilated  AORTIC VALVE:  Trileaflet. No AI/AS  MITRAL VALVE:   Normal. Trivial MR  TRICUSPID VALVE: Normal. Mild TR  PULMONIC VALVE: Normal Trivial PR  INTERATRIAL SEPTUM: Probably tiny PFO  PERICARDIUM: No effusion  DESCENDING AORTA: No significant plaque   CARDIOVERSION:     Indications:  Atrial Fibrillation  Procedure Details:  Once the TEE was complete, the patient had the defibrillator pads placed in the anterior and posterior position. Once an appropriate level of sedation was achieved, the patient received a single biphasic, synchronized 200J shock with prompt conversion to sinus rhythm. No apparent complications.   Glori Bickers, MD  8:59 AM

## 2017-01-15 NOTE — Anesthesia Postprocedure Evaluation (Signed)
Anesthesia Post Note  Patient: Theodore Weber  Procedure(s) Performed: Procedure(s) (LRB): TRANSESOPHAGEAL ECHOCARDIOGRAM (TEE) (N/A) CARDIOVERSION (N/A)     Patient location during evaluation: PACU Anesthesia Type: MAC Level of consciousness: awake and alert Pain management: pain level controlled Vital Signs Assessment: post-procedure vital signs reviewed and stable Respiratory status: spontaneous breathing, nonlabored ventilation, respiratory function stable and patient connected to nasal cannula oxygen Cardiovascular status: stable and blood pressure returned to baseline Anesthetic complications: no Comments: No antiemetics given due to MAC procedure, and no patient complaint of nausea/vomiting.     Last Vitals:  Vitals:   01/15/17 0854 01/15/17 0900  BP: 105/81 102/74  Pulse: 78 77  Resp: (!) 21 13  Temp: 36.6 C   SpO2: 99% 97%    Last Pain:  Vitals:   01/15/17 0854  TempSrc: Oral                 Catalina Gravel

## 2017-01-15 NOTE — Progress Notes (Signed)
Patient back from MRI and Amiodarone gtt resumed at 16.67ml/hr.

## 2017-01-15 NOTE — Transfer of Care (Signed)
Immediate Anesthesia Transfer of Care Note  Patient: Theodore Weber  Procedure(s) Performed: Procedure(s): TRANSESOPHAGEAL ECHOCARDIOGRAM (TEE) (N/A) CARDIOVERSION (N/A)  Patient Location: Endoscopy Unit  Anesthesia Type:General  Level of Consciousness: awake, alert , oriented and patient cooperative  Airway & Oxygen Therapy: Patient Spontanous Breathing and Patient connected to nasal cannula oxygen  Post-op Assessment: Report given to RN, Post -op Vital signs reviewed and stable and Patient moving all extremities X 4  Post vital signs: Reviewed and stable  Last Vitals:  Vitals:   01/15/17 0728 01/15/17 0854  BP: (!) 122/95   Pulse: (!) 101 78  Resp:  (!) 21  Temp: 36.7 C 36.6 C  SpO2: 100% 99%    Last Pain:  Vitals:   01/15/17 0854  TempSrc: Oral         Complications: No apparent anesthesia complications

## 2017-01-15 NOTE — Progress Notes (Signed)
Lab order obtained from MD on call for mag level. Pt is still asymptomatic.

## 2017-01-15 NOTE — Progress Notes (Signed)
Advanced Heart Failure Rounding Note   Subjective:    Didn't sleep well last night. IV infiltrated in left hand. Nervous about procedure today.  Breathing ok at rest. Remains in AF. Now on IV amio and Eliquis. Rate ~100.    Weight up 3 pounds    Objective:   Weight Range:  Vital Signs:   Temp:  [97.8 F (36.6 C)-98.1 F (36.7 C)] 98 F (36.7 C) (08/17 0728) Pulse Rate:  [0-115] 101 (08/17 0728) Resp:  [0-27] 18 (08/17 0509) BP: (87-126)/(62-98) 122/95 (08/17 0728) SpO2:  [0 %-100 %] 100 % (08/17 0728) Weight:  [83.8 kg (184 lb 11.2 oz)] 83.8 kg (184 lb 11.2 oz) (08/17 0509) Last BM Date: 01/13/17  Weight change: Filed Weights   01/13/17 0623 01/14/17 0528 01/15/17 0509  Weight: 85.8 kg (189 lb 3.2 oz) 82.3 kg (181 lb 8 oz) 83.8 kg (184 lb 11.2 oz)    Intake/Output:   Intake/Output Summary (Last 24 hours) at 01/15/17 0814 Last data filed at 01/15/17 0351  Gross per 24 hour  Intake           1115.1 ml  Output              450 ml  Net            665.1 ml     Physical Exam: General:  Sitting up in bed  No resp difficulty HEENT: normal Neck: supple. JVP 7 . Carotids 2+ bilat; no bruits. No lymphadenopathy or thryomegaly appreciated. Cor: PMI laterally displaced. IRR IRR No rubs, gallops or murmurs. Lungs: clear Abdomen: soft, nontender, nondistended. No hepatosplenomegaly. No bruits or masses. Good bowel sounds. Extremities: no cyanosis, clubbing, rash, edema  L hand swollen Neuro: alert & orientedx3, cranial nerves grossly intact. moves all 4 extremities w/o difficulty. Affect pleasant  Telemetry:  AF 100 (Personally reviewed)  Labs: Basic Metabolic Panel:  Recent Labs Lab 01/13/17 0938 01/14/17 0404 01/15/17 0345 01/15/17 0429  NA 136 136 135  --   K 3.9 3.7 4.1  --   CL 103 103 102  --   CO2 23 24 25   --   GLUCOSE 110* 97 114*  --   BUN 9 9 12   --   CREATININE 0.93 0.85 0.95  --   CALCIUM 9.3 9.0 9.1  --   MG 2.0  --   --  2.0    Liver  Function Tests:  Recent Labs Lab 01/13/17 0938  AST 21  ALT 20  ALKPHOS 54  BILITOT 1.3*  PROT 6.9  ALBUMIN 3.5   No results for input(s): LIPASE, AMYLASE in the last 168 hours. No results for input(s): AMMONIA in the last 168 hours.  CBC:  Recent Labs Lab 01/13/17 0938 01/14/17 0404 01/15/17 0345  WBC 6.8 6.3 5.8  NEUTROABS 5.1  --   --   HGB 16.2 15.5 15.4  HCT 45.7 45.2 45.8  MCV 94.2 95.4 96.2  PLT 243 233 247    Cardiac Enzymes: No results for input(s): CKTOTAL, CKMB, CKMBINDEX, TROPONINI in the last 168 hours.  BNP: BNP (last 3 results)  Recent Labs  01/13/17 0938  BNP 483.1*    ProBNP (last 3 results) No results for input(s): PROBNP in the last 8760 hours.    Other results:  Imaging: X-ray Chest Pa And Lateral  Result Date: 01/13/2017 CLINICAL DATA:  Dyspnea with exertion, renal cancer, hypertension, smoker EXAM: CHEST  2 VIEW COMPARISON:  None FINDINGS: Normal heart size,  mediastinal contours, and pulmonary vascularity. Lungs clear. No pleural effusion or pneumothorax. Bones unremarkable. IMPRESSION: No acute abnormalities. Electronically Signed   By: Lavonia Dana M.D.   On: 01/13/2017 09:31      Medications:     Scheduled Medications: . [MAR Hold] apixaban  5 mg Oral BID  . [MAR Hold] aspirin EC  81 mg Oral Daily  . [MAR Hold] atorvastatin  80 mg Oral Q0600  . [MAR Hold] carvedilol  3.125 mg Oral BID WC  . [MAR Hold] digoxin  0.125 mg Oral Daily  . [MAR Hold] feeding supplement (ENSURE ENLIVE)  237 mL Oral BID BM  . [MAR Hold] losartan  12.5 mg Oral QHS  . [MAR Hold] sodium chloride flush  3 mL Intravenous Q12H  . [MAR Hold] sodium chloride flush  3 mL Intravenous Q12H  . [MAR Hold] spironolactone  12.5 mg Oral Daily     Infusions: . [MAR Hold] sodium chloride    . sodium chloride    . amiodarone 30 mg/hr (01/15/17 0351)     PRN Medications:  [MAR Hold] sodium chloride, [MAR Hold] acetaminophen, [MAR Hold] diazepam, fentaNYL  (SUBLIMAZE) injection, [MAR Hold] ondansetron (ZOFRAN) IV, ondansetron (ZOFRAN) IV, [MAR Hold] sodium chloride flush, [MAR Hold] sodium chloride flush   Assessment:    60 y.o.malewit PMH of right renal mass consistent with Ca, HTN and ETOH abuse who presented to [facility] for right nephrectomy and found to have Afib RVR. Transferred to [facility] for further work up.   Plan/Discussion:    1. Acute systolic CHF due to NICM - Echo 01/13/17 LVEF 20-25%, Trivial MR, Mild LAE, RV mildly reduced, mild RAE,  - R/LHC 8/16 with normal coronaries and filling pressures.  - Continue digoxin, losartan and spiro. Increase spiro to 25. May need low-dose lasix. Hold b-blocker for now.  - Pt has very heavy ETOH use. Likely contributor to his CMP. ? AF - Will plan for cardiac MRI today to look for infiltrative disease and other causes of NICM. - Plan TEE/DCCV today for AF 2. Afib RVR  - CHA2DS2/VASc at least 2.  - On amio and Eliquis. Can stop ASA>  - For TEE/DCCV today 3. NSVT - Diltiazem stopped with systolic CHF - Continue po amiodarone. Appreciate EP input.  - Keep K. 4.0 Mg > 2.0 4. R Renal mass - Had been scheduled for R nephrectomy - Urology has seen. Will defer surgery until end of September 5. Alcohol Abuse - Strongly encouraged cessation. Pt has had 5-8 drinks per day for years Sheria Lang + Kenyon Ana)    Length of Stay: 2   Glori Bickers MD 01/15/2017, 8:14 AM  Advanced Heart Failure Team Pager [phone] (M-F; 7a - 4p)  Please contact [facility] for night-coverage after hours (4p -7a ) and weekends on [email or web address]

## 2017-01-15 NOTE — Interval H&P Note (Signed)
History and Physical Interval Note:  01/15/2017 8:08 AM  Theodore Weber  has presented today for surgery, with the diagnosis of a fib  The various methods of treatment have been discussed with the patient and family. After consideration of risks, benefits and other options for treatment, the patient has consented to  Procedure(s): TRANSESOPHAGEAL ECHOCARDIOGRAM (TEE) (N/A) CARDIOVERSION (N/A) as a surgical intervention .  The patient's history has been reviewed, patient examined, no change in status, stable for surgery.  I have reviewed the patient's chart and labs.  Questions were answered to the patient's satisfaction.     Bensimhon, Quillian Quince

## 2017-01-15 NOTE — Progress Notes (Signed)
Returned to room 3E15 post TEE Cardioversion.  Patient alert and oriented.  CCMD notified and he is in NSR.  Amiodarone at 16.7 ml/hr and order is to continue.  Patient and his wife updated on plan of care.

## 2017-01-15 NOTE — H&P (View-Only) (Signed)
Advanced Heart Failure Team Consult Note   Primary Physician: Not in system Primary Cardiologist:  Dr. Gwenlyn Found HF: New (Dr. [name])  Reason for Consultation: New systolic CHF  HPI:    Theodore Weber is seen today for evaluation of New systolic CHF at the request of Dr. Gwenlyn Found.   Theodore Weber is a 61 y.o. male with past medical history of R renal mass, HTN, and ETOH.   Pt presented to [facility] with several day history of R flank pain.  Imaging (CT and [place]) revealed R renal mass concerning for malignancy. Planned elective R nephrectomy. Pt found to be in Afib RVR on presentation to OR, so case cancelled. Started on cardizem drip and Heparin. Nuclear stress tested revealed small fixed defects at the apex, no stress-induced ischemia, and severe LV HK with EF estimated at 12%.    He was transferred to [facility] for further ischemic evaluation and management of his Afib.   Pertinent labs on admission include Creatinine 0.93, K 3.9, BNP 483.1, WBC 6.3.   Pt feeling OK this am. Overwhelmed with multiple consults today. He has had worsening SOB since last winter.  He has no previous history of Afib and from reports, it does not seem he was in Afib on his initial arrival to [facility]. He drinks 4-5 beers daily. He has a long history of heavy drinking. Drinks vodka daily as well. PTA, he had been working 6 days a week. Has been more tired. Denies peripheral edema. No orthopnea or PND. Wife states he does not snore.  Echo 01/13/17 LVEF 20-25%, Trivial MR, Mild LAE, RV mildly reduced, mild RAE,   R/LHC 01/14/2017 Dilated NICM with global ejection fraction of 10-15% Normal coronaries Normal R heart pressures without Pulmonary HTN.  Hemodynamics (mmHg) RA mean 3 RV 27/0 PA 30/16 (22) PCWP 15 AO 98/73 CO/CI 4.5/2.1  Review of Systems: [y] = yes, [ ]  = no   General: Weight gain [ ] ; Weight loss [ ] ; Anorexia [ ] ; Fatigue [y]; Fever [ ] ; Chills [ ] ; Weakness [ ]   Cardiac: Chest  pain/pressure [ ] ; Resting SOB [ ] ; Exertional SOB [y]; Orthopnea [ ] ; Pedal Edema [ ] ; Palpitations [ ] ; Syncope [ ] ; Presyncope [ ] ; Paroxysmal nocturnal dyspnea[ ]   Pulmonary: Cough [ ] ; Wheezing[ ] ; Hemoptysis[ ] ; Sputum [ ] ; Snoring [ ]   GI: Vomiting[ ] ; Dysphagia[ ] ; Melena[ ] ; Hematochezia [ ] ; Heartburn[ ] ; Abdominal pain [ ] ; Constipation [ ] ; Diarrhea [ ] ; BRBPR [ ]   GU: Hematuria[ ] ; Dysuria [ ] ; Nocturia[ ]   Vascular: Pain in legs with walking [ ] ; Pain in feet with lying flat [ ] ; Non-healing sores [ ] ; Stroke [ ] ; TIA [ ] ; Slurred speech [ ] ;  Neuro: Headaches[ ] ; Vertigo[ ] ; Seizures[ ] ; Paresthesias[ ] ;Blurred vision [ ] ; Diplopia [ ] ; Vision changes [ ]   Ortho/Skin: Arthritis [y]; Joint pain [y]; Muscle pain [ ] ; Joint swelling [ ] ; Back Pain [ ] ; Rash [ ]   Psych: Depression[ ] ; Anxiety[ ]   Heme: Bleeding problems [ ] ; Clotting disorders [ ] ; Anemia [ ]   Endocrine: Diabetes [ ] ; Thyroid dysfunction[ ]   Home Medications Prior to Admission medications   Medication Sig Start Date End Date Taking? Authorizing Provider  amLODipine (NORVASC) 10 MG tablet Take 10 mg by mouth daily.   Yes [provider]  OIL OF [place] PO Take 1 each by mouth See admin instructions. Take 1 dropper full by mouth daily   Yes [provider]  Omega-3 Fatty Acids (FISH OIL PO) Take 1 tablet by mouth daily.   Yes [provider]  OVER THE COUNTER MEDICATION Take 1 scoop by mouth daily. [place] Beets OTC   Yes [provider]  OVER THE COUNTER MEDICATION Take 1 each by mouth See admin instructions. Take 1 dropperfull by mouth daily. C02 Extracted Hemp Tincture 1000 mg   Yes [provider]  oxyCODONE (OXY IR/ROXICODONE) 5 MG immediate release tablet Take 5 mg by mouth every 4 (four) hours as needed for severe pain.   Yes [provider]  [facility] Wort 300 MG TABS Take 300 mg by mouth daily.    Yes [provider]  hydrochlorothiazide  (HYDRODIURIL) 25 MG tablet Take 1 tablet (25 mg total) by mouth daily. Patient not taking: Reported on 01/13/2017 04/17/14   Jeannett Senior, PA-C    Past Medical History: Past Medical History:  Diagnosis Date  . Atrial fibrillation ([place]) 12/2016  . Hypertension   . Renal mass 12/2016    Past Surgical History: Past Surgical History:  Procedure Laterality Date  . COLONOSCOPY  2012 ?  Marland Kitchen HYPOSPADIAS CORRECTION     at age 75   . RIGHT/LEFT HEART CATH AND CORONARY ANGIOGRAPHY N/A 01/14/2017   Procedure: RIGHT/LEFT HEART CATH AND CORONARY ANGIOGRAPHY;  Surgeon: Troy Sine, MD;  Location: [facility];  Service: Cardiovascular;  Laterality: N/A;    Family History: Family History  Problem Relation Age of Onset  . Hypertension Father     Social History: Social History   Social History  . Marital status: Married    Spouse name: N/A  . Number of children: N/A  . Years of education: N/A   Social History Main Topics  . Smoking status: Current Every Day Smoker    Types: Cigars  . Smokeless tobacco: Current User  . Alcohol use 1.8 oz/week    3 Cans of beer per week     Comment: daily  . Drug use: No  . Sexual activity: Not Asked   Other Topics Concern  . None   Social History Narrative  . None    Allergies:  No Known Allergies  Objective:    Vital Signs:   Temp:  [98.2 F (36.8 C)-98.9 F (37.2 C)] 98.2 F (36.8 C) (08/16 0528) Pulse Rate:  [0-121] 60 (08/16 1116) Resp:  [0-28] 16 (08/16 1015) BP: (92-127)/(60-90) 107/73 (08/16 1116) SpO2:  [0 %-100 %] 99 % (08/16 1015) Weight:  [181 lb 8 oz (82.3 kg)] 181 lb 8 oz (82.3 kg) (08/16 0528) Last BM Date: 01/13/17  Weight change: Filed Weights   01/13/17 0623 01/14/17 0528  Weight: 189 lb 3.2 oz (85.8 kg) 181 lb 8 oz (82.3 kg)    Intake/Output:   Intake/Output Summary (Last 24 hours) at 01/14/17 1415 Last data filed at 01/14/17 1343  Gross per 24 hour  Intake              480 ml  Output               450 ml  Net               30 ml      Physical Exam    General:  Well appearing. No resp difficulty HEENT: normal Neck: supple. JVP 6-7 cm. Carotids 2+ bilat; no bruits. No lymphadenopathy or thyromegaly appreciated. Cor: PMI nondisplaced. Irregularly irregular. No rubs, gallops or murmurs. Lungs: CTAB, normal effort Abdomen:  soft, nontender, nondistended. No hepatosplenomegaly. No bruits or masses. Good bowel sounds. Extremities: no cyanosis, clubbing, rash, edema Neuro: alert & orientedx3, cranial nerves grossly intact. moves all 4 extremities w/o difficulty. Affect pleasant   Telemetry   Personally reviewed, Afib 90-100s  EKG    Personally reviewed, 01/14/17. Afib 94 bpm (On Dilt drip at this time)  Labs   Basic Metabolic Panel:  Recent Labs Lab 01/13/17 0938 01/14/17 0404  NA 136 136  K 3.9 3.7  CL 103 103  CO2 23 24  GLUCOSE 110* 97  BUN 9 9  CREATININE 0.93 0.85  CALCIUM 9.3 9.0  MG 2.0  --     Liver Function Tests:  Recent Labs Lab 01/13/17 0938  AST 21  ALT 20  ALKPHOS 54  BILITOT 1.3*  PROT 6.9  ALBUMIN 3.5   No results for input(s): LIPASE, AMYLASE in the last 168 hours. No results for input(s): AMMONIA in the last 168 hours.  CBC:  Recent Labs Lab 01/13/17 0938 01/14/17 0404  WBC 6.8 6.3  NEUTROABS 5.1  --   HGB 16.2 15.5  HCT 45.7 45.2  MCV 94.2 95.4  PLT 243 233    Cardiac Enzymes: No results for input(s): CKTOTAL, CKMB, CKMBINDEX, TROPONINI in the last 168 hours.  BNP: BNP (last 3 results)  Recent Labs  01/13/17 0938  BNP 483.1*    ProBNP (last 3 results) No results for input(s): PROBNP in the last 8760 hours.   CBG: No results for input(s): GLUCAP in the last 168 hours.  Coagulation Studies:  Recent Labs  01/14/17 0404  LABPROT 14.4  INR 1.11     Imaging    No results found.   Medications:     Current Medications: . amiodarone  400 mg Oral BID  . aspirin EC  81 mg Oral Daily  . [START  ON 01/15/2017] atorvastatin  80 mg Oral Q0600  . carvedilol  3.125 mg Oral BID WC  . feeding supplement (ENSURE ENLIVE)  237 mL Oral BID BM  . sodium chloride flush  3 mL Intravenous Q12H     Infusions: . sodium chloride 50 mL/hr at 01/14/17 0911  . sodium chloride    . heparin         Patient Profile   61 y.o. male wit PMH of right renal mass consistent with Ca, HTN and ETOH abuse who presented to [facility] for right nephrectomy and found to have Afib RVR. Transferred to [facility] for further work up.   Assessment/Plan   1. Acute systolic CHF due to NICM - Echo 01/13/17 LVEF 20-25%, Trivial MR, Mild LAE, RV mildly reduced, mild RAE,  - R/LHC this am with normal coronaries and filling pressures.  - Continue coreg 3.125 mg BID - Consider low dose Losartan. - Pressures soft, will have to up-titrate medications carefully.  - He is NOT a candidate for any advanced therapies or transplant with R renal mass. This would have to be worked up fully prior to consideration of any kind.  - EP has seen to discuss ICD consideration.  - Pt has very heavy ETOH use. Likely contributor to his CMP. - Will plan for cardiac MRI to look for infiltrative disease and other causes of NICM. 2. Afib RVR  - CHA2DS2/VASc at least 2.  - Needs TEE/DCCV to restore SR. Will plan tentatively for tomorrow.  3. NSVT - Diltiazem stopped with systolic CHF - Continue po amiodarone. Appreciate EP input.  4. R Renal mass -  Had been scheduled for R nephrectomy - Urology consult pending.  5. Alcohol Abuse - Strongly encouraged cessation. Pt has had 5-8 drinks per day for years [facility] + Vodka)  Length of Stay: 1  Annamaria Helling  01/14/2017, 2:15 PM  Advanced Heart Failure Team Pager [phone] (M-F; 7a - 4p)  Please contact [facility] for night-coverage after hours (4p -7a ) and weekends on [email or web address]  Patient seen and examined with the above-signed Advanced Practice Provider and/or  Housestaff. I personally reviewed laboratory data, imaging studies and relevant notes. I independently examined the patient and formulated the important aspects of the plan. I have edited the note to reflect any of my changes or salient points. I have personally discussed the plan with the patient and/or family.  He has severe biventricular dysfunction due to NICM. Suspect likely cause is ETOH but AF also a possibility - though duration may not be long enough. Cath films and hemodynamics reviewed personally. Volume status ok. CO marginal  Would stop carvedilol. Start dig, spiro and losartan as BP tolerates. Will get cMRI. Load amio plan TEE/DC-CV. Start NOAC. Hold off on surgery for now. Check HIV and hepatitis serologies.   Glori Bickers, MD  3:13 PM

## 2017-01-15 NOTE — Progress Notes (Signed)
Jacqualin Combes, RN        # 7. S/W [facility] # [phone]    ELIQUIS  5 MG BID   COVER- YES  CO-PAY- $ 60.00  TIER- 3 DRUG  PRIOR APPROVAL- NO  DEDUCTIBLE- NO   PHARMACY : [facility]

## 2017-01-15 NOTE — Progress Notes (Signed)
Patient taken for MRI via wheelchair.  Amiodarone stopped for test and will resume when returns to room.

## 2017-01-15 NOTE — Care Management Note (Signed)
Case Management Note  Patient Details  Name: Theodore Weber MRN: [id number] Date of Birth: 09-25-1955  Subjective/Objective:                 Independent patient from home w wife. Admitted from [facility] with several day history of right flank pain. Imaging revealed right renal mass consistent with cancer. An elective right nephrectomy was planned; however, patient was found to be in Afib RVR in the OR by anesthesiology. Surgery was canceled. Patient cardioverted and remains on amio gtt. Anticipate DC to home tomorrow w Eliquis. Patient received 30 d free card and $10 copay card. No other CM needs identified at this time.    Action/Plan:   Expected Discharge Date:                  Expected Discharge Plan:  Home/Self Care  In-House Referral:     Discharge planning Services  CM Consult  Post Acute Care Choice:    Choice offered to:     DME Arranged:    DME Agency:     HH Arranged:    HH Agency:     Status of Service:  In process, will continue to follow  If discussed at Long Length of Stay Meetings, dates discussed:    Additional Comments:  Carles Collet, RN 01/15/2017, 10:35 AM

## 2017-01-15 NOTE — Anesthesia Procedure Notes (Signed)
Procedure Name: MAC Date/Time: 01/15/2017 8:23 AM Performed by: Everlean Cherry A Pre-anesthesia Checklist: Patient identified, Emergency Drugs available, Suction available and Patient being monitored Patient Re-evaluated:Patient Re-evaluated prior to induction Oxygen Delivery Method: Nasal cannula

## 2017-01-16 ENCOUNTER — Encounter (HOSPITAL_COMMUNITY): Payer: Self-pay | Admitting: Adult Health

## 2017-01-16 LAB — CBC
HCT: 47 % (ref 39.0–52.0)
Hemoglobin: 16.1 g/dL (ref 13.0–17.0)
MCH: 32.9 pg (ref 26.0–34.0)
MCHC: 34.3 g/dL (ref 30.0–36.0)
MCV: 95.9 fL (ref 78.0–100.0)
Platelets: 246 10*3/uL (ref 150–400)
RBC: 4.9 MIL/uL (ref 4.22–5.81)
RDW: 14.4 % (ref 11.5–15.5)
WBC: 7.1 10*3/uL (ref 4.0–10.5)

## 2017-01-16 LAB — BASIC METABOLIC PANEL
Anion gap: 8 (ref 5–15)
BUN: 11 mg/dL (ref 6–20)
CO2: 27 mmol/L (ref 22–32)
Calcium: 9.1 mg/dL (ref 8.9–10.3)
Chloride: 102 mmol/L (ref 101–111)
Creatinine, Ser: 0.96 mg/dL (ref 0.61–1.24)
GFR calc Af Amer: >60 mL/min (ref >60)
GFR calc non Af Amer: >60 mL/min (ref >60)
Glucose, Bld: 100 mg/dL — ABNORMAL HIGH (ref 65–99)
Potassium: 4 mmol/L (ref 3.5–5.1)
Sodium: 137 mmol/L (ref 135–145)

## 2017-01-16 LAB — MAGNESIUM: Magnesium: 2 mg/dL (ref 1.7–2.4)

## 2017-01-16 MED ORDER — APIXABAN 5 MG PO TABS: 5.0000 mg | ORAL_TABLET | Freq: Two times a day (BID) | ORAL | 11 refills | Status: DC

## 2017-01-16 MED ORDER — SPIRONOLACTONE 25 MG PO TABS: 25.0000 mg | ORAL_TABLET | Freq: Every day | ORAL | 11 refills | Status: DC

## 2017-01-16 MED ORDER — POTASSIUM CHLORIDE ER 10 MEQ PO TBCR: 20.0000 meq | EXTENDED_RELEASE_TABLET | Freq: Every day | ORAL | 6 refills | Status: DC

## 2017-01-16 MED ORDER — LOSARTAN POTASSIUM 25 MG PO TABS: 25.0000 mg | ORAL_TABLET | Freq: Every day | ORAL | 11 refills | Status: DC

## 2017-01-16 MED ORDER — AMIODARONE HCL 200 MG PO TABS: 400.0000 mg | ORAL_TABLET | Freq: Two times a day (BID) | ORAL | 1 refills | Status: DC

## 2017-01-16 MED ORDER — FUROSEMIDE 40 MG PO TABS: 40.0000 mg | ORAL_TABLET | Freq: Every day | ORAL | 11 refills | Status: DC

## 2017-01-16 MED ORDER — DIGOXIN 125 MCG PO TABS: 0.1250 mg | ORAL_TABLET | Freq: Every day | ORAL | 11 refills | Status: DC

## 2017-01-16 NOTE — Discharge Summary (Signed)
Pt got discharged to home, discharge instructions provided and patient showed understanding to it, double check with pharmacy with pt's lasix and potassium dose, pt left hospital ambulatory accompanied with his Wife. Pt's IV and Tele dc from previous 7am-3pm shift RN

## 2017-01-16 NOTE — Progress Notes (Signed)
Advanced Heart Failure Rounding Note   Subjective:    Underwent DC-CV on 8/17. Remains in NSR.   cMRI with EF 24%   Feels great. Anxious to go home.  Denies dyspnea.   Objective:   Weight Range:  Vital Signs:   Temp:  [98.1 F (36.7 C)-98.3 F (36.8 C)] 98.1 F (36.7 C) (08/18 0602) Pulse Rate:  [65-82] 66 (08/18 1208) Resp:  [18] 18 (08/18 0602) BP: (93-127)/(74-88) 102/74 (08/18 1208) SpO2:  [97 %-100 %] 100 % (08/18 1208) Weight:  [83 kg (183 lb)] 83 kg (183 lb) (08/18 0602) Last BM Date: 01/15/17  Weight change: Filed Weights   01/14/17 0528 01/15/17 0509 01/16/17 0602  Weight: 82.3 kg (181 lb 8 oz) 83.8 kg (184 lb 11.2 oz) 83 kg (183 lb)    Intake/Output:   Intake/Output Summary (Last 24 hours) at 01/16/17 1451 Last data filed at 01/16/17 1300  Gross per 24 hour  Intake           977.27 ml  Output             1200 ml  Net          -222.73 ml     Physical Exam: General:  Sitting in chair No resp difficulty HEENT: normal Neck: supple. no JVD. Carotids 2+ bilat; no bruits. No lymphadenopathy or thryomegaly appreciated. Cor: PMI laterally displaced. Regular rate & rhythm. No rubs, gallops or murmurs. Lungs: clear Abdomen: soft, nontender, nondistended. No hepatosplenomegaly. No bruits or masses. Good bowel sounds. Extremities: no cyanosis, clubbing, rash, edema Neuro: alert & orientedx3, cranial nerves grossly intact. moves all 4 extremities w/o difficulty. Affect pleasant   Telemetry:  NSR 70s Personally reviewed   Labs: Basic Metabolic Panel:  Recent Labs Lab 01/13/17 0938 01/14/17 0404 01/15/17 0345 01/15/17 0429 01/16/17 0514  NA 136 136 135  --  137  K 3.9 3.7 4.1  --  4.0  CL 103 103 102  --  102  CO2 23 24 25   --  27  GLUCOSE 110* 97 114*  --  100*  BUN 9 9 12   --  11  CREATININE 0.93 0.85 0.95  --  0.96  CALCIUM 9.3 9.0 9.1  --  9.1  MG 2.0  --   --  2.0 2.0    Liver Function Tests:  Recent Labs Lab 01/13/17 0938  AST  21  ALT 20  ALKPHOS 54  BILITOT 1.3*  PROT 6.9  ALBUMIN 3.5   No results for input(s): LIPASE, AMYLASE in the last 168 hours. No results for input(s): AMMONIA in the last 168 hours.  CBC:  Recent Labs Lab 01/13/17 0938 01/14/17 0404 01/15/17 0345 01/16/17 0514  WBC 6.8 6.3 5.8 7.1  NEUTROABS 5.1  --   --   --   HGB 16.2 15.5 15.4 16.1  HCT 45.7 45.2 45.8 47.0  MCV 94.2 95.4 96.2 95.9  PLT 243 233 247 246    Cardiac Enzymes: No results for input(s): CKTOTAL, CKMB, CKMBINDEX, TROPONINI in the last 168 hours.  BNP: BNP (last 3 results)  Recent Labs  01/13/17 0938  BNP 483.1*    ProBNP (last 3 results) No results for input(s): PROBNP in the last 8760 hours.    Other results:  Imaging: Mr Cardiac Morphology W Wo Contrast  Result Date: 01/15/2017 CLINICAL DATA:  Cardiomyopathy EXAM: CARDIAC MRI TECHNIQUE: The patient was scanned on a 1.5 Tesla GE magnet. A dedicated cardiac coil was used. Functional imaging  was done using Fiesta sequences. 2,3, and 4 chamber views were done to assess for RWMA's. Modified Simpson's rule using a short axis stack was used to calculate an ejection fraction on a dedicated work Conservation officer, nature. The patient received 27 cc of Multihance. After 10 minutes inversion recovery sequences were used to assess for infiltration and scar tissue. CONTRAST:  27 cc Multihance FINDINGS: There was moderate LAE and mild RAE. The RV was normal in size and function The aortic root was 3.6 cm in diameter. There was no ASD/VSD There was a trivial pericardial effusion The aortic valve was tri leaflet and normal. There was mild MR. There was mild TR The LV was severely dilated with diffuse hypokinesis. There was no mural apical thrombus. The quantitative EF Was 24% (EDV 234 cc ESV 179 cc SV 55 cc) Delayed enhancement images with gadolinium showed no infiltration or scar IMPRESSION: 1) Severe LVE with diffuse hypokinesis EF 24% 2) Normla RV size and function  3) Aortic root 3.6 cm 4) Moderate LAE and mild RAE 5) No gadolinium uptake on delayed inversion recovery sequences No scar, infarct or infiltration 6) Mild MR 7) Mild TR 8) Trivial pericardial effusion Jenkins Rouge Electronically Signed   By: Jenkins Rouge M.D.   On: 01/15/2017 17:06     Medications:     Scheduled Medications: . apixaban  5 mg Oral BID  . aspirin EC  81 mg Oral Daily  . atorvastatin  80 mg Oral Q0600  . digoxin  0.125 mg Oral Daily  . feeding supplement (ENSURE ENLIVE)  237 mL Oral BID BM  . losartan  12.5 mg Oral QHS  . sodium chloride flush  3 mL Intravenous Q12H  . sodium chloride flush  3 mL Intravenous Q12H  . spironolactone  25 mg Oral Daily    Infusions: . sodium chloride    . sodium chloride    . amiodarone 30 mg/hr (01/16/17 0635)    PRN Medications: sodium chloride, acetaminophen, diazepam, ondansetron (ZOFRAN) IV, sodium chloride flush, sodium chloride flush   Assessment:    61 y.o.malewit PMH of right renal mass consistent with Ca, HTN and ETOH abuse who presented to [facility] for right nephrectomy and found to have Afib RVR. Transferred to [facility] for further work up.   Plan/Discussion:    1. Acute systolic CHF due to NICM - Echo 01/13/17 LVEF 20-25%, Trivial MR, Mild LAE, RV mildly reduced, mild RAE,  - R/LHC 8/16 with normal coronaries and filling pressures.  - Continue digoxin, losartan and spiro.Hold b-blocker for now.  - Pt has very heavy ETOH use. Likely contributor to his CMP. ? AF - cMRI EF 24%  2. Afib RVR  - CHA2DS2/VASc at least 2.  - On amio and Eliquis.  - s/p TEE/DCCV 8/17 in NSR 3. NSVT - Diltiazem stopped with systolic CHF - Continue po amiodarone. Appreciate EP input.  - Keep K. 4.0 Mg > 2.0 4. R Renal mass - Had been scheduled for R nephrectomy - Urology has seen. Will defer surgery until end of September 5. Alcohol Abuse - Strongly encouraged cessation. Pt has had 5-8 drinks per day for years (Beer +  Vodka)   Ok to go home today. Meds on d/c  Amio 400 bid Eliquis 5 bid Losartan 25 daily Arlyce Harman 25 daily  Digoxin 0.125 daily  Lasix 40 mg on Monday and Friday with kcl 20 (can also take prn)  Stop ASA and statin  F/u HF Clinic 1 week  with labs No ETOH.  Length of Stay: 3   Bensimhon, Daniel MD 01/16/2017, 2:51 PM  Advanced Heart Failure Team Pager [phone] (M-F; 7a - 4p)  Please contact [facility] for night-coverage after hours (4p -7a ) and weekends on [email or web address]

## 2017-01-16 NOTE — Discharge Summary (Signed)
Discharge Summary    Patient ID: Theodore Weber,  MRN: [id number], DOB/AGE: Aug 27, 1955 61 y.o.  Admit date: 01/13/2017 Discharge date: 01/16/2017  Primary Care Provider: Patient, No Pcp Per Primary Cardiologist: Bensimhon   Discharge Diagnoses    Principal Problem:   Atrial fibrillation with RVR ([place]) Active Problems:   Essential hypertension   Acute systolic heart failure (HCC)   NICM (nonischemic cardiomyopathy) (HCC)   Malnutrition of moderate degree   Allergies No Known Allergies  Diagnostic Studies/Procedures    TEE with Direct current cardioversion-01/15/2017 FINDINGS:  LEFT VENTRICLE: Dilated EF = 15%. Severe global HK   RIGHT VENTRICLE: Severe global HK   LEFT ATRIUM: Dilated  LEFT ATRIAL APPENDAGE: + smoke. No organized clot  RIGHT ATRIUM: Dilated  AORTIC VALVE:  Trileaflet. No AI/AS  MITRAL VALVE:   Normal. Trivial MR  TRICUSPID VALVE: Normal. Mild TR  PULMONIC VALVE: Normal Trivial PR  INTERATRIAL SEPTUM: Probably tiny PFO  PERICARDIUM: No effusion  DESCENDING AORTA: No significant plaque   CARDIOVERSION:     Indications:  Atrial Fibrillation  Procedure Details:  Once the TEE was complete, the patient had the defibrillator pads placed in the anterior and posterior position. Once an appropriate level of sedation was achieved, the patient received a single biphasic, synchronized 200J shock with prompt conversion to sinus rhythm. No apparent complications.  Right and Left Cardiac Catheterization 01/14/2017 Conclusion     There is severe left ventricular systolic dysfunction.   Dilated nonischemic cardiomyopathy with a global ejection fraction of 10-15%.  Normal coronary arteries.  Normal right heart pressures without evidence for pulmonary hypertension.  RECOMMENDATION: Guideline directed medical therapy for severe LV dysfunction with probable plan for  ICD therapy.    Echocardiogram 01/13/2017 Left  ventricle: The cavity size was mildly dilated. Wall   thickness was normal. Indeterminant diastolic function (atrial   fibrillation). Systolic function was severely reduced. The   estimated ejection fraction was in the range of 20% to 25%.   Diffuse hypokinesis. - Aortic valve: There was no stenosis. - Aorta: Mildly dilated aortic root. Aortic root dimension: 37 mm   (ED). - Mitral valve: There was trivial regurgitation. - Left atrium: The atrium was mildly dilated. - Right ventricle: The cavity size was normal. Systolic function   was mildly reduced. - Right atrium: The atrium was mildly dilated. - Pulmonary arteries: No complete TR doppler jet so unable to   estimate PA systolic pressure. - Inferior vena cava: The vessel was normal in size. The   respirophasic diameter changes were in the normal range (>= 50%),   consistent with normal central venous pressure.  _____________   History of Present Illness     Theodore Weber Is a 61 year old male with history of right renal mass consistent with cancer, hypertension, EtOH abuse, who was seen originally at [facility] for right nephrectomy, and found have A. fib RVR. The patient was transferred to [facility] for further evaluation and treatment.  Hospital Course     Consultants: Dr. Saralyn Pilar [facility]-urology                       Dr. Caryl Comes, EP   On arrival the patient was placed on diltiazem drip and a heparin drip. Echocardiogram was completed on 01/13/2017 with results above but generally revealed a LVEF of 20-25%, trivial MR, mild LAE, RV mildly reduced, with mild RAE. In the setting of reduced ejection fraction the patient had a right and left  cardiac catheterization the following day on 01/14/2017. Please see results above. In general, the patient had a dilated nonischemic cardiomyopathy with normal coronary arteries, normal right heart pressures without pulmonary hypertension.  He subsequently underwent a TEE cardioversion,  the patient converted to normal sinus rhythm. Due to CHADS VASC score of 2 he was started on ELIQUIS. He was also started on amiodarone. He was seen by EP, Dr. Virl Axe on 01/14/2017. It was his recommendation that the patient would need to continue medical therapy. Pending and urology and cancer prognosis, along with his response to medications concerning his reduced EF, further decisions will be made concerning ICD implant patient.  Due to reduced ejection fraction with LVEF between 20 and 25%, the patient was started on digoxin, losartan, and spironolactone. Beta blocker was discontinued. Diltiazem was discontinued in the setting of systolic heart failure. The patient was strongly recommended to discontinue EtOH use.  Concerning right renal mass, the patient is scheduled for surgery at the end of December 2018.  He was seen and examined by Dr. [name] and found to be stable for discharge.    _____________  Discharge Vitals Blood pressure 102/74, pulse 66, temperature 98.1 F (36.7 C), temperature source Oral, resp. rate 18, height 6\' 3"  (1.905 m), weight 183 lb (83 kg), SpO2 100 %.  Filed Weights   01/14/17 0528 01/15/17 0509 01/16/17 0602  Weight: 181 lb 8 oz (82.3 kg) 184 lb 11.2 oz (83.8 kg) 183 lb (83 kg)    Labs & Radiologic Studies     CBC  Recent Labs  01/15/17 0345 01/16/17 0514  WBC 5.8 7.1  HGB 15.4 16.1  HCT 45.8 47.0  MCV 96.2 95.9  PLT 247 295   Basic Metabolic Panel  Recent Labs  01/15/17 0345 01/15/17 0429 01/16/17 0514  NA 135  --  137  K 4.1  --  4.0  CL 102  --  102  CO2 25  --  27  GLUCOSE 114*  --  100*  BUN 12  --  11  CREATININE 0.95  --  0.96  CALCIUM 9.1  --  9.1  MG  --  2.0 2.0   X-ray Chest Pa And Lateral  Result Date: 01/13/2017 CLINICAL DATA:  Dyspnea with exertion, renal cancer, hypertension, smoker EXAM: CHEST  2 VIEW COMPARISON:  None FINDINGS: Normal heart size, mediastinal contours, and pulmonary vascularity. Lungs clear.  No pleural effusion or pneumothorax. Bones unremarkable. IMPRESSION: No acute abnormalities. Electronically Signed   By: Lavonia Dana M.D.   On: 01/13/2017 09:31   Mr Cardiac Morphology W Wo Contrast  Result Date: 01/15/2017 CLINICAL DATA:  Cardiomyopathy EXAM: CARDIAC MRI TECHNIQUE: The patient was scanned on a 1.5 Tesla GE magnet. A dedicated cardiac coil was used. Functional imaging was done using Fiesta sequences. 2,3, and 4 chamber views were done to assess for RWMA's. Modified Simpson's rule using a short axis stack was used to calculate an ejection fraction on a dedicated work Conservation officer, nature. The patient received 27 cc of Multihance. After 10 minutes inversion recovery sequences were used to assess for infiltration and scar tissue. CONTRAST:  27 cc Multihance FINDINGS: There was moderate LAE and mild RAE. The RV was normal in size and function The aortic root was 3.6 cm in diameter. There was no ASD/VSD There was a trivial pericardial effusion The aortic valve was tri leaflet and normal. There was mild MR. There was mild TR The LV was severely dilated with diffuse hypokinesis.  There was no mural apical thrombus. The quantitative EF Was 24% (EDV 234 cc ESV 179 cc SV 55 cc) Delayed enhancement images with gadolinium showed no infiltration or scar IMPRESSION: 1) Severe LVE with diffuse hypokinesis EF 24% 2) Normla RV size and function 3) Aortic root 3.6 cm 4) Moderate LAE and mild RAE 5) No gadolinium uptake on delayed inversion recovery sequences No scar, infarct or infiltration 6) Mild MR 7) Mild TR 8) Trivial pericardial effusion [place] Rouge Electronically Signed   By: Jenkins Rouge M.D.   On: 01/15/2017 17:06    Disposition   Pt is being discharged home today in good condition.  Follow-up Plans & Appointments    Follow-up Information    Bensimhon, Shaune Pascal, MD Follow up.   Specialty:  Cardiology Why:  Our office will call you for appointment post hospitalization.  Contact  information: [place] [address] [phone]        Bensimhon, Shaune Pascal, MD Follow up.   Specialty:  Cardiology Contact information: [address] [place] [address] [phone]            Discharge Medications   Current Discharge Medication List    START taking these medications   Details  amiodarone (PACERONE) 200 MG tablet Take 2 tablets (400 mg total) by mouth 2 (two) times daily. Qty: 120 tablet, Refills: 1    apixaban (ELIQUIS) 5 MG TABS tablet Take 1 tablet (5 mg total) by mouth 2 (two) times daily. Qty: 60 tablet, Refills: 11    digoxin (LANOXIN) 0.125 MG tablet Take 1 tablet (0.125 mg total) by mouth daily. Qty: 30 tablet, Refills: 11    furosemide (LASIX) 40 MG tablet Take 1 tablet (40 mg total) by mouth daily. Take one tablet on Monday and Friday with one 20 mEq potassium tablet. Qty: 30 tablet, Refills: 11    losartan (COZAAR) 25 MG tablet Take 1 tablet (25 mg total) by mouth at bedtime. Qty: 30 tablet, Refills: 11    potassium chloride (K-DUR) 10 MEQ tablet Take 2 tablets (20 mEq total) by mouth daily. Take with lasix on Mondays and Fridays Qty: 30 tablet, Refills: 6    spironolactone (ALDACTONE) 25 MG tablet Take 1 tablet (25 mg total) by mouth daily. Qty: 30 tablet, Refills: 11      CONTINUE these medications which have NOT CHANGED   Details  OIL OF OREGANO PO Take 1 each by mouth See admin instructions. Take 1 dropper full by mouth daily    Omega-3 Fatty Acids (FISH OIL PO) Take 1 tablet by mouth daily.    !! OVER THE COUNTER MEDICATION Take 1 scoop by mouth daily. Bountiful Beets OTC    !! OVER THE COUNTER MEDICATION Take 1 each by mouth See admin instructions. Take 1 dropperfull by mouth daily. C02 Extracted Hemp Tincture 1000 mg    St Johns Wort 300 MG TABS Take 300 mg by mouth daily.      !! - Potential duplicate medications found. Please discuss with provider.    STOP taking these  medications     amLODipine (NORVASC) 10 MG tablet      oxyCODONE (OXY IR/ROXICODONE) 5 MG immediate release tablet      hydrochlorothiazide (HYDRODIURIL) 25 MG tablet            Outstanding Labs/Studies   None  Duration of Discharge Encounter 40 minutes   Greater than 30 minutes including physician time.  Signed, Curt Bears  Brooks Sailors DNP, ANP, AACC   01/16/2017, 4:03 PM

## 2017-01-18 ENCOUNTER — Telehealth (HOSPITAL_COMMUNITY): Payer: Self-pay | Admitting: Pharmacist

## 2017-01-18 ENCOUNTER — Encounter (HOSPITAL_COMMUNITY): Payer: Self-pay | Admitting: Internal Medicine

## 2017-01-18 MED ORDER — FUROSEMIDE 40 MG PO TABS: 40.0000 mg | ORAL_TABLET | ORAL | 5 refills | Status: DC

## 2017-01-18 MED ORDER — POTASSIUM CHLORIDE ER 10 MEQ PO TBCR: 20.0000 meq | EXTENDED_RELEASE_TABLET | ORAL | 5 refills | Status: DC

## 2017-01-18 NOTE — Telephone Encounter (Signed)
Received a phone call from Ms. Jeon about Mr. Poucher directions from his furosemide and KCl Rx. Based on Dr. Clayborne Dana note on 8/18, Mr. Boehringer should be taking furosemide 40 mg on Monday and Friday and KCl 20 mEq on Monday and Friday. Two different directions were sent to the pharmacy. Rx for both stated patient should take daily and then also had the Monday and Friday directions. Clarified with patient's wife that he should only be taking furosemide and KCl on Monday and Friday.   Ruta Hinds. Velva Harman, PharmD, BCPS, CPP Clinical Pharmacist Pager: [phone] Phone: [phone] 01/18/2017 10:02 AM

## 2017-01-26 ENCOUNTER — Encounter (HOSPITAL_COMMUNITY): Payer: Self-pay

## 2017-01-26 ENCOUNTER — Ambulatory Visit (HOSPITAL_COMMUNITY)
Admission: RE | Admit: 2017-01-26 | Discharge: 2017-01-26 | Disposition: A | Discharge status: Home / Self Care | Payer: 59 | Source: Ambulatory Visit | Attending: Internal Medicine | Admitting: Internal Medicine

## 2017-01-26 ENCOUNTER — Telehealth (HOSPITAL_COMMUNITY): Payer: Self-pay | Admitting: Vascular Surgery

## 2017-01-26 VITALS — BP 100/54 | HR 50 | Wt 183.0 lb

## 2017-01-26 DIAGNOSIS — I110 Hypertensive heart disease with heart failure: Secondary | ICD-10-CM | POA: Diagnosis not present

## 2017-01-26 DIAGNOSIS — I480 Paroxysmal atrial fibrillation: Secondary | ICD-10-CM

## 2017-01-26 DIAGNOSIS — I429 Cardiomyopathy, unspecified: Secondary | ICD-10-CM | POA: Insufficient documentation

## 2017-01-26 DIAGNOSIS — Z8249 Family history of ischemic heart disease and other diseases of the circulatory system: Secondary | ICD-10-CM | POA: Diagnosis not present

## 2017-01-26 DIAGNOSIS — Z905 Acquired absence of kidney: Secondary | ICD-10-CM | POA: Diagnosis not present

## 2017-01-26 DIAGNOSIS — I428 Other cardiomyopathies: Secondary | ICD-10-CM | POA: Diagnosis not present

## 2017-01-26 DIAGNOSIS — F1010 Alcohol abuse, uncomplicated: Secondary | ICD-10-CM | POA: Insufficient documentation

## 2017-01-26 DIAGNOSIS — I4729 Other ventricular tachycardia: Secondary | ICD-10-CM

## 2017-01-26 DIAGNOSIS — I4891 Unspecified atrial fibrillation: Secondary | ICD-10-CM | POA: Diagnosis not present

## 2017-01-26 DIAGNOSIS — N2889 Other specified disorders of kidney and ureter: Secondary | ICD-10-CM | POA: Diagnosis not present

## 2017-01-26 DIAGNOSIS — I472 Ventricular tachycardia: Secondary | ICD-10-CM | POA: Insufficient documentation

## 2017-01-26 DIAGNOSIS — I5022 Chronic systolic (congestive) heart failure: Secondary | ICD-10-CM | POA: Diagnosis not present

## 2017-01-26 DIAGNOSIS — Z7902 Long term (current) use of antithrombotics/antiplatelets: Secondary | ICD-10-CM | POA: Insufficient documentation

## 2017-01-26 LAB — COMPREHENSIVE METABOLIC PANEL
ALT: 34 U/L (ref 17–63)
AST: 25 U/L (ref 15–41)
Albumin: 4 g/dL (ref 3.5–5.0)
Alkaline Phosphatase: 58 U/L (ref 38–126)
Anion gap: 8 (ref 5–15)
BUN: 34 mg/dL — ABNORMAL HIGH (ref 6–20)
CO2: 26 mmol/L (ref 22–32)
Calcium: 9.5 mg/dL (ref 8.9–10.3)
Chloride: 101 mmol/L (ref 101–111)
Creatinine, Ser: 1.66 mg/dL — ABNORMAL HIGH (ref 0.61–1.24)
GFR calc Af Amer: 51 mL/min — ABNORMAL LOW (ref >60)
GFR calc non Af Amer: 44 mL/min — ABNORMAL LOW (ref >60)
Glucose, Bld: 89 mg/dL (ref 65–99)
Potassium: 5.4 mmol/L — ABNORMAL HIGH (ref 3.5–5.1)
Sodium: 135 mmol/L (ref 135–145)
Total Bilirubin: 1 mg/dL (ref 0.3–1.2)
Total Protein: 7.2 g/dL (ref 6.5–8.1)

## 2017-01-26 LAB — CBC
HCT: 47.1 % (ref 39.0–52.0)
Hemoglobin: 16.5 g/dL (ref 13.0–17.0)
MCH: 33.8 pg (ref 26.0–34.0)
MCHC: 35 g/dL (ref 30.0–36.0)
MCV: 96.5 fL (ref 78.0–100.0)
Platelets: 338 10*3/uL (ref 150–400)
RBC: 4.88 MIL/uL (ref 4.22–5.81)
RDW: 13.5 % (ref 11.5–15.5)
WBC: 7.3 10*3/uL (ref 4.0–10.5)

## 2017-01-26 MED ORDER — SPIRONOLACTONE 25 MG PO TABS: 25.0000 mg | ORAL_TABLET | Freq: Every day | ORAL | 11 refills | Status: DC

## 2017-01-26 MED ORDER — FUROSEMIDE 40 MG PO TABS: 40.0000 mg | ORAL_TABLET | Freq: Every day | ORAL | 6 refills | Status: DC | PRN

## 2017-01-26 MED ORDER — AMIODARONE HCL 200 MG PO TABS: 200.0000 mg | ORAL_TABLET | Freq: Two times a day (BID) | ORAL | 6 refills | Status: DC

## 2017-01-26 NOTE — Progress Notes (Signed)
Advanced Heart Failure Clinic Note   Referring Physician: Dr. Gwenlyn Found Primary Care: No PCP Primary Cardiologist: Dr. Avelina Laine. Theodore Weber   HPI: Theodore Weber is a 61 y.o. male with past medical history of R renal mass, HTN, and ETOH abuse and chronic systolic CHF (EF 38-25%).   He was admitted 01/13/17-01/16/17 to [facility] as a transfer from [facility] with a several day history of flank pain. CT and US revealed R renal mass concerning for malignancy. Plan was for an elective R nephrectomy. On presentation to the OR he was in Afib RVR, so case was cancelled. He was started on cardizem and heparin gtts. Underwent nuclear stress test that showed no ischemia, severe LV HK with EF estimated 12%. Echo with EF 20-25%. He underwent TEE/DCCV, with return to NSR. He was started on Eliquis and amiodarone. He was seen by EP who recommended medical therapy pending cancer prognosis prior to considering ICD. Surgery for R nephrectomy is schedule for Dec. 2018. He was seen by the CHF team and started on Digoxin, losartan and Spiro. Noted to have very heavy ETOH use. Discharge weight 183 pounds.   He returns today for HF follow up. Feeling great. He is walking 3 miles a day without SOB. He and his wife are intentional about his sodium intake, eating less than 1500 mg a day. Drinking less than 2L a day. He has not had any ETOH since he left the hospital. He is taking all of his medications. Reports some lightheadedness when standing up over the past 2 days. He denies chest pain, orthopnea, PND.   Review of Systems: [y] = yes, [ ]  = no   General: Weight gain [ ] ; Weight loss [ ] ; Anorexia [ ] ; Fatigue [ ] ; Fever [ ] ; Chills [ ] ; Weakness [ ]   Cardiac: Chest pain/pressure [ ] ; Resting SOB [ ] ; Exertional SOB [ ] ; Orthopnea [ ] ; Pedal Edema [ ] ; Palpitations [ ] ; Syncope [ ] ; Presyncope [ ] ; Paroxysmal nocturnal dyspnea[ ]   Pulmonary: Cough [ ] ; Wheezing[ ] ; Hemoptysis[ ] ; Sputum [ ] ; Snoring [ ]   GI: Vomiting[ ] ;  Dysphagia[ ] ; Melena[ ] ; Hematochezia [ ] ; Heartburn[ ] ; Abdominal pain [ ] ; Constipation [ ] ; Diarrhea [ ] ; BRBPR [ ]   GU: Hematuria[ ] ; Dysuria [ ] ; Nocturia[ ]   Vascular: Pain in legs with walking [ ] ; Pain in feet with lying flat [ ] ; Non-healing sores [ ] ; Stroke [ ] ; TIA [ ] ; Slurred speech [ ] ;  Neuro: Headaches[ ] ; Vertigo[ ] ; Seizures[ ] ; Paresthesias[ ] ;Blurred vision [ ] ; Diplopia [ ] ; Vision changes [ ]   Ortho/Skin: Arthritis [ y]; Joint pain [ ] ; Muscle pain [ ] ; Joint swelling [ ] ; Back Pain [ ] ; Rash [ ]   Psych: Depression[ ] ; Anxiety[ ]   Heme: Bleeding problems [ ] ; Clotting disorders [ ] ; Anemia [ ]   Endocrine: Diabetes [ ] ; Thyroid dysfunction[ ]    Past Medical History:  Diagnosis Date  . Atrial fibrillation ([place]) 12/2016  . Hypertension   . Renal mass 12/2016  . Systolic dysfunction with acute on chronic heart failure ([place]) 01/15/2017    Current Outpatient Prescriptions  Medication Sig Dispense Refill  . amiodarone (PACERONE) 200 MG tablet Take 2 tablets (400 mg total) by mouth 2 (two) times daily. 120 tablet 1  . apixaban (ELIQUIS) 5 MG TABS tablet Take 1 tablet (5 mg total) by mouth 2 (two) times daily. 60 tablet 11  . digoxin (LANOXIN) 0.125 MG tablet Take 1 tablet (0.125 mg total) by  mouth daily. 30 tablet 11  . furosemide (LASIX) 40 MG tablet Take 1 tablet (40 mg total) by mouth 2 (two) times a week. Take on Mon and Fri with additional as needed for SOB or wt gain 16 tablet 5  . losartan (COZAAR) 25 MG tablet Take 1 tablet (25 mg total) by mouth at bedtime. 30 tablet 11  . OIL OF OREGANO PO Take 1 each by mouth See admin instructions. Take 1 dropper full by mouth daily    . Omega-3 Fatty Acids (FISH OIL PO) Take 1 tablet by mouth daily.    Marland Kitchen OVER THE COUNTER MEDICATION Take 1 scoop by mouth daily. Bountiful Beets OTC    . OVER THE COUNTER MEDICATION Take 1 each by mouth See admin instructions. Take 1 dropperfull by mouth daily. C02 Extracted Hemp Tincture 1000 mg     . potassium chloride (K-DUR) 10 MEQ tablet Take 2 tablets (20 mEq total) by mouth 2 (two) times a week. Take with lasix on Mondays and Fridays with additional 20 mEq as needed. 16 tablet 5  . spironolactone (ALDACTONE) 25 MG tablet Take 1 tablet (25 mg total) by mouth daily. 30 tablet 11  . St Johns Wort 300 MG TABS Take 300 mg by mouth daily.      No current facility-administered medications for this encounter.     No Known Allergies    Social History   Social History  . Marital status: Married    Spouse name: N/A  . Number of children: N/A  . Years of education: N/A   Occupational History  . Not on file.   Social History Main Topics  . Smoking status: Current Every Day Smoker    Types: Cigars  . Smokeless tobacco: Current User  . Alcohol use 1.8 oz/week    3 Cans of beer per week     Comment: daily  . Drug use: No  . Sexual activity: Not on file   Other Topics Concern  . Not on file   Social History Narrative  . No narrative on file      Family History  Problem Relation Age of Onset  . Hypertension Father     Vitals:   01/26/17 1052  BP: (!) 100/54  Pulse: (!) 50  SpO2: 100%  Weight: 183 lb (83 kg)     PHYSICAL EXAM: General:  Thin male, NAD.  HEENT: normal Neck: supple. no JVD. Carotids 2+ bilat; no bruits. No lymphadenopathy or thyromegaly appreciated. Cor: PMI nondisplaced. Regular rate & rhythm. No rubs, gallops or murmurs. Lungs: clear bilaterally.  Abdomen: soft, nontender, nondistended. No hepatosplenomegaly. No bruits or masses. Good bowel sounds. Extremities: no cyanosis, clubbing, rash, edema Neuro: alert & oriented x 3, cranial nerves grossly intact. moves all 4 extremities w/o difficulty. Affect pleasant.  ECG: Sinus brady - personally reviewed.    ASSESSMENT & PLAN: 1. Chronic systolic CHF due to NICM: Echo 01/13/17 LVEF 20-25%, trivial MR, mild LAE, RV mildly elevated, mild RAE. R/L heart cath Aug. 2016 with normal cors. CM felt to  be related to ETOH and tachy-mediated. cMRI with EF 24%.  - NYHA I - Volume stable to dry. He has brought a chart of his weights, which are stable. Advised him to stop twice weekly Lasix as he has been dizzy. SBP with 10 point drop when going from sitting to standing. Will make lasix prn only for weight gain > 3 pounds in one day or 5 pounds in one week. Stop Kcl  as well.  - Change Spiro to 25 mg hs.  - Continue losartan 25 mg hs.  - No beta blocker, HR is 50 - Continue digoxin 0.125 mg - CMET today.  - Repeat Echo in 3 months. Will need to see EP for ICD if EF does not recover.   2. Atrial fibrillation: s/p DCCV - Remains on NSR today.  - Reduce Amio to 200 mg BID.  - CHA2DS2/VASc at least 2.  - Continue Eliquis for anticoagulation.   3. NSVT - Continue Amio as above.  - CMET today.   4. R Renal mass - Sees Dr. Alyson Ingles, he will call the office and make an appointment.   5. Alcohol Abuse - Has stopped drinking completely. Congratulated him.   Follow up in one month with Dr. Haroldine Weber. I have written him a note that he can return to work no more than 40 hours a week. 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     Arbutus Leas, NP 01/26/17

## 2017-01-26 NOTE — Patient Instructions (Addendum)
Please DISCONTINUE St. John's Wort since it interacts with your Eliquis.   STOP Potassium.  CHANGE lasix to once daily AS NEEDED for weight gain/swelling.  CHANGE Spironolactone to 25 mg tablet once daily AT BEDTIME.  DECREASE Amiodarone to 200 mg twice daily.  Routine lab work today. Will notify you of abnormal results, otherwise no news is good news!  Dr. Nicolette Bang [facility] Address: [address], [place], [place] [address] Phone: [phone]  Work letter provided for restrictions to work no more than 40 hrs weekly.  Follow up 1 month with Dr. [name].  Take all medication as prescribed the day of your appointment. Bring all medications with you to your appointment.  Do the following things EVERYDAY: 1) Weigh yourself in the morning before breakfast. Write it down and keep it in a log. 2) Take your medicines as prescribed 3) Eat low salt foods-Limit salt (sodium) to 2000 mg per day.  4) Stay as active as you can everyday 5) Limit all fluids for the day to less than 2 liters

## 2017-01-26 NOTE — Progress Notes (Addendum)
Advanced Heart Failure Medication Review by a Pharmacist  Does the patient  feel that his/her medications are working for him/her?  yes  Has the patient been experiencing any side effects to the medications prescribed?  no  Does the patient measure his/her own blood pressure or blood glucose at home?  yes   Does the patient have any problems obtaining medications due to transportation or finances?   no  Understanding of regimen: good Understanding of indications: good Potential of compliance: good Patient understands to avoid NSAIDs. Patient understands to avoid decongestants.  Issues to address at subsequent visits: None   Pharmacist comments: Theodore Weber is a pleasant 61 yo M presenting with his wife and a recent discharge medication list. He reports great compliance with his regimen and states that he feels better than he's felt in a while since his DCCV. I did recommend discontinuation of [place] since it is a CYP3A4 and PGP inducer which may decrease Eliquis concentrations. He did not have any specific medication-related questions or concerns for me at this time.   Ruta Hinds. Velva Harman, PharmD, BCPS, CPP Clinical Pharmacist Pager: [phone] Phone: [phone] 01/26/2017 11:04 AM      Time with patient: 10 minutes Preparation and documentation time: 2 minutes Total time: 12 minutes

## 2017-01-26 NOTE — Telephone Encounter (Signed)
Spoke to Theodore Weber at [facility] to get pt scheduled, she sates she will send message to Dr.Mckenzie to get pt a f/u date , they will contact pt for appt

## 2017-02-03 ENCOUNTER — Telehealth (HOSPITAL_COMMUNITY): Payer: Self-pay | Admitting: Cardiology

## 2017-02-03 DIAGNOSIS — I509 Heart failure, unspecified: Secondary | ICD-10-CM

## 2017-02-03 NOTE — Telephone Encounter (Signed)
Patient aware. Via wife, repeat labs 9/7

## 2017-02-03 NOTE — Telephone Encounter (Signed)
-----   Message from Arbutus Leas, NP sent at 01/26/2017  1:15 PM EDT ----- Please tell Mr. Gunning that his K was slightly high. Will stop KCl supplement as we planned. Lasix stopped as well. Please repeat BMET next week.

## 2017-02-05 ENCOUNTER — Ambulatory Visit (HOSPITAL_COMMUNITY)
Admission: RE | Admit: 2017-02-05 | Discharge: 2017-02-05 | Disposition: A | Discharge status: Home / Self Care | Payer: 59 | Source: Ambulatory Visit | Attending: Internal Medicine | Admitting: Internal Medicine

## 2017-02-05 DIAGNOSIS — I509 Heart failure, unspecified: Secondary | ICD-10-CM | POA: Insufficient documentation

## 2017-02-05 LAB — BASIC METABOLIC PANEL
Anion gap: 9 (ref 5–15)
BUN: 31 mg/dL — ABNORMAL HIGH (ref 6–20)
CO2: 26 mmol/L (ref 22–32)
Calcium: 9.3 mg/dL (ref 8.9–10.3)
Chloride: 98 mmol/L — ABNORMAL LOW (ref 101–111)
Creatinine, Ser: 1.51 mg/dL — ABNORMAL HIGH (ref 0.61–1.24)
GFR calc Af Amer: 57 mL/min — ABNORMAL LOW (ref >60)
GFR calc non Af Amer: 49 mL/min — ABNORMAL LOW (ref >60)
Glucose, Bld: 90 mg/dL (ref 65–99)
Potassium: 5.1 mmol/L (ref 3.5–5.1)
Sodium: 133 mmol/L — ABNORMAL LOW (ref 135–145)

## 2017-02-17 ENCOUNTER — Encounter (HOSPITAL_COMMUNITY): Payer: Self-pay | Admitting: Internal Medicine

## 2017-02-17 ENCOUNTER — Ambulatory Visit (HOSPITAL_COMMUNITY)
Admission: RE | Admit: 2017-02-17 | Discharge: 2017-02-17 | Disposition: A | Discharge status: Home / Self Care | Payer: 59 | Source: Ambulatory Visit | Attending: Internal Medicine | Admitting: Internal Medicine

## 2017-02-17 VITALS — BP 110/67 | HR 58 | Wt 190.5 lb

## 2017-02-17 DIAGNOSIS — I5022 Chronic systolic (congestive) heart failure: Secondary | ICD-10-CM | POA: Insufficient documentation

## 2017-02-17 DIAGNOSIS — F1010 Alcohol abuse, uncomplicated: Secondary | ICD-10-CM | POA: Diagnosis not present

## 2017-02-17 DIAGNOSIS — E875 Hyperkalemia: Secondary | ICD-10-CM | POA: Insufficient documentation

## 2017-02-17 DIAGNOSIS — I472 Ventricular tachycardia: Secondary | ICD-10-CM | POA: Insufficient documentation

## 2017-02-17 DIAGNOSIS — N2889 Other specified disorders of kidney and ureter: Secondary | ICD-10-CM | POA: Diagnosis not present

## 2017-02-17 DIAGNOSIS — I4891 Unspecified atrial fibrillation: Secondary | ICD-10-CM | POA: Diagnosis not present

## 2017-02-17 DIAGNOSIS — N179 Acute kidney failure, unspecified: Secondary | ICD-10-CM | POA: Insufficient documentation

## 2017-02-17 DIAGNOSIS — I110 Hypertensive heart disease with heart failure: Secondary | ICD-10-CM | POA: Insufficient documentation

## 2017-02-17 DIAGNOSIS — Z905 Acquired absence of kidney: Secondary | ICD-10-CM | POA: Diagnosis not present

## 2017-02-17 DIAGNOSIS — F17210 Nicotine dependence, cigarettes, uncomplicated: Secondary | ICD-10-CM | POA: Insufficient documentation

## 2017-02-17 LAB — BASIC METABOLIC PANEL
Anion gap: 14 (ref 5–15)
BUN: 31 mg/dL — ABNORMAL HIGH (ref 6–20)
CO2: 22 mmol/L (ref 22–32)
Calcium: 9.3 mg/dL (ref 8.9–10.3)
Chloride: 95 mmol/L — ABNORMAL LOW (ref 101–111)
Creatinine, Ser: 1.36 mg/dL — ABNORMAL HIGH (ref 0.61–1.24)
GFR calc Af Amer: >60 mL/min (ref >60)
GFR calc non Af Amer: 55 mL/min — ABNORMAL LOW (ref >60)
Glucose, Bld: 97 mg/dL (ref 65–99)
Potassium: 5.1 mmol/L (ref 3.5–5.1)
Sodium: 131 mmol/L — ABNORMAL LOW (ref 135–145)

## 2017-02-17 MED ORDER — AMIODARONE HCL 200 MG PO TABS: 200.0000 mg | ORAL_TABLET | Freq: Every day | ORAL | 6 refills | Status: DC

## 2017-02-17 MED ORDER — SPIRONOLACTONE 25 MG PO TABS: 12.5000 mg | ORAL_TABLET | Freq: Every day | ORAL | 11 refills | Status: DC

## 2017-02-17 NOTE — Progress Notes (Signed)
Advanced Heart Failure Clinic Note   Referring Physician: Dr. Gwenlyn Found Primary Care: No PCP Primary Cardiologist: Dr. Avelina Laine. Theodore Weber   HPI: Theodore Weber is a 61 y.o. male with past medical history of R renal mass, HTN, and ETOH abuse and chronic systolic CHF (EF 58-52%).   He was admitted 01/13/17-01/16/17 to [facility] as a transfer from [facility] with a several day history of flank pain. CT and US revealed R renal mass concerning for malignancy. Plan was for an elective R nephrectomy. On presentation to the OR he was in Afib RVR, so case was cancelled. He was started on cardizem and heparin gtts. Underwent nuclear stress test that showed no ischemia, severe LV HK with EF estimated 12%. Echo with EF 20-25%. He underwent TEE/DCCV, with return to NSR. He was started on Eliquis and amiodarone. He was seen by EP who recommended medical therapy pending cancer prognosis prior to considering ICD. Surgery for R nephrectomy is schedule for Dec. 2018. He was seen by the CHF team and started on Digoxin, losartan and Spiro. Noted to have very heavy ETOH use. Discharge weight 183 pounds.   Today he returns for HF follow up. Last visit spironolactone was changed to qhs and amio was cut back to 200 mg twice a day. Overall feeling good. Denies SOB/PND/Orthopnea. SBP at home in the 90s . Heart rate at home around 60. Has not been drinking alcohol. Over the last month he has walked 91 miles. Weight at home 181-183 pounds. Taking all medications.    Past Medical History:  Diagnosis Date  . Atrial fibrillation ([place]) 12/2016  . Hypertension   . Renal mass 12/2016  . Systolic dysfunction with acute on chronic heart failure ([place]) 01/15/2017    Current Outpatient Prescriptions  Medication Sig Dispense Refill  . amiodarone (PACERONE) 200 MG tablet Take 1 tablet (200 mg total) by mouth 2 (two) times daily. 60 tablet 6  . apixaban (ELIQUIS) 5 MG TABS tablet Take 1 tablet (5 mg total) by mouth 2 (two) times  daily. 60 tablet 11  . digoxin (LANOXIN) 0.125 MG tablet Take 1 tablet (0.125 mg total) by mouth daily. 30 tablet 11  . furosemide (LASIX) 40 MG tablet Take 1 tablet (40 mg total) by mouth daily as needed (For weight gain 3 lbs or more overnight, 5 lbs or more in 1 week). 30 tablet 6  . losartan (COZAAR) 25 MG tablet Take 1 tablet (25 mg total) by mouth at bedtime. 30 tablet 11  . OIL OF OREGANO PO Take 1 each by mouth See admin instructions. Take 1 dropper full by mouth daily    . Omega-3 Fatty Acids (FISH OIL PO) Take 1 tablet by mouth daily.    Marland Kitchen OVER THE COUNTER MEDICATION Take 1 scoop by mouth daily. Bountiful Beets OTC    . OVER THE COUNTER MEDICATION Take 1 each by mouth See admin instructions. Take 1 dropperfull by mouth daily. C02 Extracted Hemp Tincture 1000 mg    . spironolactone (ALDACTONE) 25 MG tablet Take 1 tablet (25 mg total) by mouth at bedtime. 30 tablet 11   No current facility-administered medications for this encounter.     No Known Allergies    Social History   Social History  . Marital status: Married    Spouse name: N/A  . Number of children: N/A  . Years of education: N/A   Occupational History  . Not on file.   Social History Main Topics  . Smoking status: Current Every  Day Smoker    Types: Cigars  . Smokeless tobacco: Current User  . Alcohol use 1.8 oz/week    3 Cans of beer per week     Comment: daily  . Drug use: No  . Sexual activity: Not on file   Other Topics Concern  . Not on file   Social History Narrative  . No narrative on file      Family History  Problem Relation Age of Onset  . Hypertension Father     Vitals:   02/17/17 1458  BP: 110/67  Pulse: (!) 58  SpO2: 98%  Weight: 190 lb 8 oz (86.4 kg)   Filed Weights   02/17/17 1458  Weight: 190 lb 8 oz (86.4 kg)    PHYSICAL EXAM: General:  Well appearing. No resp difficulty HEENT: normal Neck: supple. JV{ 5-6. Carotids 2+ bilat; no bruits. No lymphadenopathy or  thryomegaly appreciated. Cor: PMI nondisplaced. Regular rate & rhythm. No rubs, gallops or murmurs. Lungs: clear Abdomen: soft, nontender, nondistended. No hepatosplenomegaly. No bruits or masses. Good bowel sounds. Extremities: no cyanosis, clubbing, rash, edema Neuro: alert & orientedx3, cranial nerves grossly intact. moves all 4 extremities w/o difficulty. Affect pleasant   ASSESSMENT & PLAN: 1. Chronic systolic CHF due to NICM: Echo 01/13/17 LVEF 20-25%, trivial MR, mild LAE, RV mildly elevated, mild RAE. R/L heart cath Aug. 2016 with normal cors. CM felt to be related to ETOH and tachy-mediated. cMRI with EF 24%.  - Dr. Haroldine Weber performed portable ECHO with EF improving 30-35%.   - NYHA I. Volume status stable. Does not need lasix.  -Cut back spiro to 12.5 mg qhs.   - Continue losartan 25 mg hs.  - No BB with soft bp and low heart rate.  -Stop digoxin.  - Repeat Echo in 3 months. Will need to see EP for ICD if EF does not recover.   2. Atrial fibrillation: s/p DCCV - Regular pulse. Cut back amio to 200 mg daily.  - CHA2DS2/VASc at least 2.  - Continue Eliquis for anticoagulation.   3. NSVT - Continue Amio as above. Cut back amio today.  - BMET today  4. R Renal mass - He has follow up October 2nd.   5. Alcohol Abuse Has not had alcohol since August 11.   6. AKI- Creatinine up from 0.96>1.66>1.51. Repeat BMET today. Cut back spiro to 12.5 mg daily.    7. Hyperkalemia- Check BMET. Counseled to cut back bananas.    Darrick Grinder, NP 02/17/17   Patient seen and examined with Darrick Grinder, NP. We discussed all aspects of the encounter. I agree with the assessment and plan as stated above.   He is improving. Bedside echo done personally EF 30-35%. Has stopped ETOH. NYHA I.   BP too low to titrate meds. Will actually cut spiro back to 12.5. Can stop digoxin.   Ok to proceed with nephrectomy. D/w Dr. Alinda Money in Urology by phone.   Glori Bickers, MD  11:25 PM

## 2017-02-17 NOTE — Patient Instructions (Addendum)
STOP Digoxin  DECREASE Amiodarone to 200mg  daily  DECREASE Spironolactone to 12.5 mg daily.  Routine lab work today. Will notify you of abnormal results  Follow up with Dr.Bensimhon in 6 weeks.

## 2017-03-04 ENCOUNTER — Other Ambulatory Visit: Payer: Self-pay | Admitting: Urology

## 2017-03-05 ENCOUNTER — Encounter (HOSPITAL_COMMUNITY): Payer: 59 | Admitting: Internal Medicine

## 2017-03-09 NOTE — Progress Notes (Signed)
02-17-17 Cardiac clearance from Dr. Jeffie Pollock in  [place]  01-26-17 [facility]) EKG  01-13-17 (EP[facility]) ECHO, CXR

## 2017-03-10 NOTE — Patient Instructions (Addendum)
Theodore Weber  03/10/2017   Your procedure is scheduled on: 03-15-17   Report to [facility] Main  Entrance Take [place] Elevators to 3rd floor to  [place] at 5:15 AM.   Call this number if you have problems the morning of surgery [phone]    Remember: ONLY 1 PERSON MAY GO WITH YOU TO SHORT STAY TO GET  READY MORNING OF [place].  Do not eat food or drink liquids :After Midnight.     Take these medicines the morning of surgery with A SIP OF WATER: Pacerone (Amiodarone)                                You may not have any metal on your body including hair pins and              piercings  Do not wear jewelry, lotions, powders, deodorant             Men may shave face and neck.   Do not bring valuables to the hospital. [place].  Contacts, dentures or bridgework may not be worn into surgery.  Leave suitcase in the car. After surgery it may be brought to your room.                Please read over the following fact sheets you were given: _____________________________________________________________________             [facility] - Preparing for Surgery Before surgery, you can play an important role.  Because skin is not sterile, your skin needs to be as free of germs as possible.  You can reduce the number of germs on your skin by washing with CHG (chlorahexidine gluconate) soap before surgery.  CHG is an antiseptic cleaner which kills germs and bonds with the skin to continue killing germs even after washing. Please DO NOT use if you have an allergy to CHG or antibacterial soaps.  If your skin becomes reddened/irritated stop using the CHG and inform your nurse when you arrive at Short Stay. Do not shave (including legs and underarms) for at least 48 hours prior to the first CHG shower.  You may shave your face/neck. Please follow these instructions carefully:  1.  Shower with CHG Soap the  night before surgery and the  morning of Surgery.  2.  If you choose to wash your hair, wash your hair first as usual with your  normal  shampoo.  3.  After you shampoo, rinse your hair and body thoroughly to remove the  shampoo.                           4.  Use CHG as you would any other liquid soap.  You can apply chg directly  to the skin and wash                       Gently with a scrungie or clean washcloth.  5.  Apply the CHG Soap to your body ONLY FROM THE NECK DOWN.   Do not use on face/ open  Wound or open sores. Avoid contact with eyes, ears mouth and genitals (private parts).                       Wash face,  Genitals (private parts) with your normal soap.             6.  Wash thoroughly, paying special attention to the area where your surgery  will be performed.  7.  Thoroughly rinse your body with warm water from the neck down.  8.  DO NOT shower/wash with your normal soap after using and rinsing off  the CHG Soap.                9.  Pat yourself dry with a clean towel.            10.  Wear clean pajamas.            11.  Place clean sheets on your bed the night of your first shower and do not  sleep with pets. Day of Surgery : Do not apply any lotions/deodorants the morning of surgery.  Please wear clean clothes to the hospital/surgery center.  FAILURE TO FOLLOW THESE INSTRUCTIONS MAY RESULT IN THE CANCELLATION OF YOUR SURGERY PATIENT SIGNATURE_________________________________  NURSE SIGNATURE__________________________________  ________________________________________________________________________  WHAT IS A BLOOD TRANSFUSION? Blood Transfusion Information  A transfusion is the replacement of blood or some of its parts. Blood is made up of multiple cells which provide different functions.  Red blood cells carry oxygen and are used for blood loss replacement.  White blood cells fight against infection.  Platelets control bleeding.  Plasma  helps clot blood.  Other blood products are available for specialized needs, such as hemophilia or other clotting disorders. BEFORE THE TRANSFUSION  Who gives blood for transfusions?   Healthy volunteers who are fully evaluated to make sure their blood is safe. This is blood bank blood. Transfusion therapy is the safest it has ever been in the practice of medicine. Before blood is taken from a donor, a complete history is taken to make sure that person has no history of diseases nor engages in risky social behavior (examples are intravenous drug use or sexual activity with multiple partners). The donor's travel history is screened to minimize risk of transmitting infections, such as malaria. The donated blood is tested for signs of infectious diseases, such as HIV and hepatitis. The blood is then tested to be sure it is compatible with you in order to minimize the chance of a transfusion reaction. If you or a relative donates blood, this is often done in anticipation of surgery and is not appropriate for emergency situations. It takes many days to process the donated blood. RISKS AND COMPLICATIONS Although transfusion therapy is very safe and saves many lives, the main dangers of transfusion include:   Getting an infectious disease.  Developing a transfusion reaction. This is an allergic reaction to something in the blood you were given. Every precaution is taken to prevent this. The decision to have a blood transfusion has been considered carefully by your caregiver before blood is given. Blood is not given unless the benefits outweigh the risks. AFTER THE TRANSFUSION  Right after receiving a blood transfusion, you will usually feel much better and more energetic. This is especially true if your red blood cells have gotten low (anemic). The transfusion raises the level of the red blood cells which carry oxygen, and this usually causes an energy increase.  The  nurse administering the transfusion  will monitor you carefully for complications. HOME CARE INSTRUCTIONS  No special instructions are needed after a transfusion. You may find your energy is better. Speak with your caregiver about any limitations on activity for underlying diseases you may have. SEEK MEDICAL CARE IF:   Your condition is not improving after your transfusion.  You develop redness or irritation at the intravenous (IV) site. SEEK IMMEDIATE MEDICAL CARE IF:  Any of the following symptoms occur over the next 12 hours:  Shaking chills.  You have a temperature by mouth above 102 F (38.9 C), not controlled by medicine.  Chest, back, or muscle pain.  People around you feel you are not acting correctly or are confused.  Shortness of breath or difficulty breathing.  Dizziness and fainting.  You get a rash or develop hives.  You have a decrease in urine output.  Your urine turns a dark color or changes to pink, red, or brown. Any of the following symptoms occur over the next 10 days:  You have a temperature by mouth above 102 F (38.9 C), not controlled by medicine.  Shortness of breath.  Weakness after normal activity.  The white part of the eye turns yellow (jaundice).  You have a decrease in the amount of urine or are urinating less often.  Your urine turns a dark color or changes to pink, red, or brown. Document Released: 05/15/2000 Document Revised: 08/10/2011 Document Reviewed: 01/02/2008 [facility] Patient Information 2014 [place], [place].  _______________________________________________________________________

## 2017-03-11 ENCOUNTER — Telehealth (HOSPITAL_COMMUNITY): Payer: Self-pay | Admitting: *Deleted

## 2017-03-11 ENCOUNTER — Encounter (HOSPITAL_COMMUNITY): Payer: Self-pay

## 2017-03-11 ENCOUNTER — Encounter (HOSPITAL_COMMUNITY)
Admission: RE | Admit: 2017-03-11 | Discharge: 2017-03-11 | Disposition: A | Discharge status: Home / Self Care | Payer: 59 | Source: Ambulatory Visit | Attending: Urology | Admitting: Urology

## 2017-03-11 DIAGNOSIS — Z01818 Encounter for other preprocedural examination: Secondary | ICD-10-CM | POA: Insufficient documentation

## 2017-03-11 LAB — BASIC METABOLIC PANEL
Anion gap: 6 (ref 5–15)
BUN: 16 mg/dL (ref 6–20)
CO2: 28 mmol/L (ref 22–32)
Calcium: 9.3 mg/dL (ref 8.9–10.3)
Chloride: 97 mmol/L — ABNORMAL LOW (ref 101–111)
Creatinine, Ser: 0.94 mg/dL (ref 0.61–1.24)
GFR calc Af Amer: >60 mL/min (ref >60)
GFR calc non Af Amer: >60 mL/min (ref >60)
Glucose, Bld: 95 mg/dL (ref 65–99)
Potassium: 4.7 mmol/L (ref 3.5–5.1)
Sodium: 131 mmol/L — ABNORMAL LOW (ref 135–145)

## 2017-03-11 LAB — CBC
HCT: 37 % — ABNORMAL LOW (ref 39.0–52.0)
Hemoglobin: 13 g/dL (ref 13.0–17.0)
MCH: 32.1 pg (ref 26.0–34.0)
MCHC: 35.1 g/dL (ref 30.0–36.0)
MCV: 91.4 fL (ref 78.0–100.0)
Platelets: 297 10*3/uL (ref 150–400)
RBC: 4.05 MIL/uL — ABNORMAL LOW (ref 4.22–5.81)
RDW: 12.2 % (ref 11.5–15.5)
WBC: 8.7 10*3/uL (ref 4.0–10.5)

## 2017-03-11 LAB — ABO/RH: ABO/RH(D): A POS

## 2017-03-11 NOTE — Telephone Encounter (Signed)
Received note from [facility], pt is sch for surgery on 03/15/17 with Dr Alinda Money and clearance to hold Eliquis.  Per Dr [name] "ok to proceed, can hold eliquis for 3 days, restart asap after surgery"   Note faxed back to them at [phone]

## 2017-03-11 NOTE — Progress Notes (Addendum)
Spoke to Dr. Milton Ferguson, [place] regarding 01-13-17 ECHO results, cardiac clearance and office note from Dr. Jeffie Pollock. Per Dr. Milton Ferguson, okay to proceed with surgery.  Pt  also stated that he received prep instructions from surgeon's office.

## 2017-03-13 NOTE — H&P (Signed)
Office Visit Report     03/02/2017   --------------------------------------------------------------------------------   Theodore Weber  MRN: [id number]  PRIMARY CARE:    DOB: 03-16-1956, 61 year old Male  REFERRING:  Glori Bickers  SSN: [id number]  PROVIDER:  Raynelle Bring, M.D.    LOCATION:  [facility] [phone]   --------------------------------------------------------------------------------   CC/HPI: CC: Right renal neoplasm  Physician requesting consult: Dr. Pierre Bali  Primary care physician: None   Theodore Weber is a pleasant 61 year old gentleman seen today at the request of Dr. Pierre Bali for a right renal neoplasm concerning for malignancy. He initially developed right sided flank pain that was severe causing him to present to the emergency department in [place] during the summer. An ultrasound was performed that confirmed a solid appearing right renal mass. A subsequent CT scan with and without contrast was performed and confirmed an enhancing 6 cm mass located on the lower portion of the right kidney consistent with renal cell carcinoma. He was seen by a urologist in [place] and was taken to the operating room. At the time of his planned surgery, he developed atrial fibrillation with rapid ventricular response. His procedure was canceled. He was also noted to be in congestive heart failure with an ejection fraction of 12% at that time. He was subsequent transferred to [facility] and was seen by Dr. [name]. He has been managed medically and is currently on amiodarone and Eliquis for his atrial fibrillation. He had been treated with diuretic therapy, ACE inhibitor's, and had been on digoxin (which she has now been able to come off of), with subsequent improvement of his congestive heart failure. His most recent echocardiogram within the last couple of weeks demonstrated improvement of his ejection fraction to 35%. He has been completely  asymptomatic and denies any shortness of breath, lower extremity swelling, chest pain, or palpitations.   Family history of kidney cancer: None  Family history of ESRD: None   Imaging: CT scan of abdomen and pelvis (01/04/17)  Side of renal neoplasm: Right  Size of renal neoplasm: 6 cm  Location of renal neoplasm: Lower pole/interpolar  Exophytic or endophytic: Partially exophytic   Renal artery anatomy: Branching renal artery  Renal vein anatomy: Single renal vein   Contralateral renal lesions: None.  Regional lymphadenopathy: None.  Adrenal masses: None.  Renal vein/IVC involvement: No.  Metastatic disease to the abdomen: No.   Chest imaging: PENDING  LFTs: Normal.   Baseline renal function: Cr 1.5 , eGFR ml/min   PMH: Past medical history is significant for atrial fibrillation, systolic heart failure, hypertension.  PSH: No abdominal surgeries.     ALLERGIES: None   MEDICATIONS: Amiodarone Hcl  Apixaban  Furosemide  Losartan Potassium  Potassium Chloride  Spironolactone     GU PSH: MAGPI Hypospad Repair      PSH Notes: hypospadius   NON-GU PSH: None   GU PMH: None   NON-GU PMH: Atrial Fibrillation Congestive heart failure Hypertension    FAMILY HISTORY: 2 daughters - Runs in Family   SOCIAL HISTORY: Marital Status: Married Preferred Language: English; Ethnicity: Not Hispanic Or Latino; Race: White Current Smoking Status: Patient has never smoked.   Tobacco Use Assessment Completed: Used Tobacco in last 30 days? Does not drink anymore.  Drinks 2 caffeinated drinks per day.    REVIEW OF SYSTEMS:    GU Review Male:   Patient reports get up at night to urinate. Patient denies frequent urination, hard to postpone  urination, burning/ pain with urination, leakage of urine, stream starts and stops, trouble starting your streams, and have to strain to urinate .  Gastrointestinal (Upper):   Patient denies nausea and vomiting.  Gastrointestinal (Lower):    Patient denies diarrhea and constipation.  Constitutional:   Patient denies fever, night sweats, weight loss, and fatigue.  Skin:   Patient denies skin rash/ lesion and itching.  Eyes:   Patient denies blurred vision and double vision.  Ears/ Nose/ Throat:   Patient denies sore throat and sinus problems.  Hematologic/Lymphatic:   Patient denies swollen glands and easy bruising.  Cardiovascular:   Patient denies leg swelling and chest pains.  Respiratory:   Patient denies cough and shortness of breath.  Endocrine:   Patient denies excessive thirst.  Musculoskeletal:   Patient denies back pain and joint pain.  Neurological:   Patient denies dizziness and headaches.  Psychologic:   Patient denies depression and anxiety.   VITAL SIGNS: None   MULTI-SYSTEM PHYSICAL EXAMINATION:    Constitutional: Well-nourished. No physical deformities. Normally developed. Good grooming.  Neck: Neck symmetrical, not swollen. Normal tracheal position.  Respiratory: No labored breathing, no use of accessory muscles. Clear bilaterally.  Cardiovascular: Normal temperature, normal extremity pulses, no swelling, no varicosities. Regular rate and rhythm noted today.  Lymphatic: No enlargement of neck, axillae, groin.  Skin: No paleness, no jaundice, no cyanosis. No lesion, no ulcer, no rash.  Neurologic / Psychiatric: Oriented to time, oriented to place, oriented to person. No depression, no anxiety, no agitation.  Gastrointestinal: No mass, no tenderness, no rigidity, non obese abdomen.  Eyes: Normal conjunctivae. Normal eyelids.  Ears, Nose, Mouth, and Throat: Left ear no scars, no lesions, no masses. Right ear no scars, no lesions, no masses. Nose no scars, no lesions, no masses. Normal hearing. Normal lips.  Musculoskeletal: Normal gait and station of head and neck.     PAST DATA REVIEWED:  Source Of History:  Patient  Lab Test Review:   BUN/Creatinine  Records Review:   Previous Patient Records  Urine Test  Review:   Urinalysis  X-Ray Review: C.T. Abdomen/Pelvis: Reviewed Films.     PROCEDURES:          Urinalysis Dipstick Dipstick Cont'd  Color: Yellow Bilirubin: Neg  Appearance: Clear Ketones: Neg  Specific Gravity: 1.015 Blood: Neg  pH: 7.5 Protein: Trace  Glucose: Neg Urobilinogen: 0.2    Nitrites: Neg    Leukocyte Esterase: Neg    ASSESSMENT:      ICD-10 Details  1 GU:   Right renal neoplasm - D49.511    PLAN:           Orders Labs CMP          Schedule X-Rays: 1 Week - C.T. Chest With I.V. Contrast - This week would be ideal.  Return Visit/Planned Activity: Other See Visit Notes             Note: We'll call to schedule surgery          Document Letter(s):  Created for Patient: Clinical Summary         Notes:   1. Right renal neoplasm: The patient was provided information regarding their renal mass including the relative risk of benign versus malignant pathology and the natural history of renal cell carcinoma and other possible malignancies of the kidney. The role of renal biopsy, laboratory testing, and imaging studies to further characterize renal masses and/or the presence of metastatic disease were explained. We discussed  the role of active surveillance, surgical therapy with both radical nephrectomy and nephron-sparing surgery, and ablative therapy in the treatment of renal masses. In addition, we discussed our goals of providing an accurate diagnosis and oncologic control while maintaining optimal renal function as appropriate based on the size, location, and complexity of their renal mass as well as their co-morbidities.   We have discussed the risks of treatment in detail including but not limited to bleeding, infection, heart attack, stroke, death, venothromoboembolism, cancer recurrence, injury/damage to surrounding organs and structures, urine leak, the possibility of open surgical conversion for patients undergoing minimally invasive surgery, the risk of  developing chronic kidney disease and its associated implications, and the potential risk of end stage renal disease possibly necessitating dialysis.   His staging evaluation will be completed with a CT scan of the chest and laboratory studies today. I have recommended proceeding with a right laparoscopic radical nephrectomy. I have discussed the situation with Dr. [name] and he appears at an acceptable risk from a cardiovascular standpoint to proceed with a laparoscopic nephrectomy. We will plan to limit his IV fluids during his procedure. All questions were answered to his stated satisfaction today. This will be scheduled for the near future.   Cc: Dr. Pierre Bali    * Signed by Raynelle Bring, M.D. on 03/02/17 at 9:03 PM (EDT)*       APPENDED NOTES:  Repeat Cr 1.0.     * Signed by Raynelle Bring, M.D. on 03/03/17 at 9:14 AM (EDT)*

## 2017-03-15 ENCOUNTER — Inpatient Hospital Stay (HOSPITAL_COMMUNITY): Payer: 59 | Admitting: Certified Registered"

## 2017-03-15 ENCOUNTER — Inpatient Hospital Stay (HOSPITAL_COMMUNITY)
Admission: RE | Admit: 2017-03-15 | Discharge: 2017-03-17 | DRG: 657 | Disposition: A | Discharge status: Home / Self Care | Payer: 59 | Source: Ambulatory Visit | Attending: Urology | Admitting: Urology

## 2017-03-15 ENCOUNTER — Encounter (HOSPITAL_COMMUNITY)
Admission: RE | Disposition: A | Discharge status: Home / Self Care | Payer: Self-pay | Source: Ambulatory Visit | Attending: Urology

## 2017-03-15 ENCOUNTER — Encounter (HOSPITAL_COMMUNITY): Payer: Self-pay | Admitting: *Deleted

## 2017-03-15 DIAGNOSIS — I4891 Unspecified atrial fibrillation: Secondary | ICD-10-CM | POA: Diagnosis present

## 2017-03-15 DIAGNOSIS — I5022 Chronic systolic (congestive) heart failure: Secondary | ICD-10-CM | POA: Diagnosis present

## 2017-03-15 DIAGNOSIS — Z7901 Long term (current) use of anticoagulants: Secondary | ICD-10-CM

## 2017-03-15 DIAGNOSIS — N2889 Other specified disorders of kidney and ureter: Secondary | ICD-10-CM | POA: Diagnosis present

## 2017-03-15 DIAGNOSIS — C641 Malignant neoplasm of right kidney, except renal pelvis: Principal | ICD-10-CM | POA: Diagnosis present

## 2017-03-15 DIAGNOSIS — I110 Hypertensive heart disease with heart failure: Secondary | ICD-10-CM | POA: Diagnosis present

## 2017-03-15 HISTORY — PX: LAPAROSCOPIC NEPHRECTOMY: SHX1930

## 2017-03-15 LAB — BASIC METABOLIC PANEL
Anion gap: 11 (ref 5–15)
BUN: 19 mg/dL (ref 6–20)
CO2: 23 mmol/L (ref 22–32)
Calcium: 8.6 mg/dL — ABNORMAL LOW (ref 8.9–10.3)
Chloride: 96 mmol/L — ABNORMAL LOW (ref 101–111)
Creatinine, Ser: 1.26 mg/dL — ABNORMAL HIGH (ref 0.61–1.24)
GFR calc Af Amer: >60 mL/min (ref >60)
GFR calc non Af Amer: >60 mL/min (ref >60)
Glucose, Bld: 148 mg/dL — ABNORMAL HIGH (ref 65–99)
Potassium: 4.4 mmol/L (ref 3.5–5.1)
Sodium: 130 mmol/L — ABNORMAL LOW (ref 135–145)

## 2017-03-15 LAB — HEMOGLOBIN AND HEMATOCRIT, BLOOD
HCT: 34.4 % — ABNORMAL LOW (ref 39.0–52.0)
Hemoglobin: 12.1 g/dL — ABNORMAL LOW (ref 13.0–17.0)

## 2017-03-15 LAB — TYPE AND SCREEN
ABO/RH(D): A POS
Antibody Screen: NEGATIVE

## 2017-03-15 SURGERY — NEPHRECTOMY, RADICAL, LAPAROSCOPIC, ADULT
Anesthesia: General | Laterality: Right

## 2017-03-15 MED ORDER — HYDROMORPHONE HCL-NACL 0.5-0.9 MG/ML-% IV SOSY
0.2500 mg | PREFILLED_SYRINGE | INTRAVENOUS | Status: DC | PRN
  Administered 2017-03-15 (×4): 0.5 mg via INTRAVENOUS

## 2017-03-15 MED ORDER — ONDANSETRON HCL 4 MG/2ML IJ SOLN
INTRAMUSCULAR | Status: AC
  Filled 2017-03-15: qty 2

## 2017-03-15 MED ORDER — OXYCODONE HCL 5 MG PO CAPS: 5.0000 mg | ORAL_CAPSULE | ORAL | 0 refills | Status: DC | PRN

## 2017-03-15 MED ORDER — LIDOCAINE 2% (20 MG/ML) 5 ML SYRINGE
INTRAMUSCULAR | Status: AC
  Filled 2017-03-15: qty 5

## 2017-03-15 MED ORDER — MIDAZOLAM HCL 2 MG/2ML IJ SOLN
INTRAMUSCULAR | Status: AC
  Filled 2017-03-15: qty 2

## 2017-03-15 MED ORDER — MORPHINE SULFATE (PF) 4 MG/ML IV SOLN
2.0000 mg | INTRAVENOUS | Status: DC | PRN
  Administered 2017-03-15 – 2017-03-16 (×3): 2 mg via INTRAVENOUS
  Filled 2017-03-15 (×3): qty 1

## 2017-03-15 MED ORDER — BUPIVACAINE LIPOSOME 1.3 % IJ SUSP
20.0000 mL | Freq: Once | INTRAMUSCULAR | Status: DC
  Filled 2017-03-15: qty 20

## 2017-03-15 MED ORDER — LIDOCAINE HCL (CARDIAC) 20 MG/ML IV SOLN
INTRAVENOUS | Status: DC | PRN
  Administered 2017-03-15: 40 mg via INTRATRACHEAL
  Administered 2017-03-15: 60 mg via INTRATRACHEAL

## 2017-03-15 MED ORDER — FENTANYL CITRATE (PF) 250 MCG/5ML IJ SOLN
INTRAMUSCULAR | Status: AC
  Filled 2017-03-15: qty 5

## 2017-03-15 MED ORDER — CEFAZOLIN SODIUM-DEXTROSE 2-4 GM/100ML-% IV SOLN
2.0000 g | Freq: Once | INTRAVENOUS | Status: AC
  Administered 2017-03-15: 2 g via INTRAVENOUS

## 2017-03-15 MED ORDER — SUGAMMADEX SODIUM 500 MG/5ML IV SOLN
INTRAVENOUS | Status: DC | PRN
  Administered 2017-03-15: 300 mg via INTRAVENOUS

## 2017-03-15 MED ORDER — CEFAZOLIN SODIUM-DEXTROSE 2-4 GM/100ML-% IV SOLN
INTRAVENOUS | Status: AC
  Filled 2017-03-15: qty 100

## 2017-03-15 MED ORDER — LACTATED RINGERS IR SOLN
Status: DC | PRN
  Administered 2017-03-15: 1000 mL

## 2017-03-15 MED ORDER — CHLORHEXIDINE GLUCONATE 4 % EX LIQD: Freq: Once | CUTANEOUS | Status: DC

## 2017-03-15 MED ORDER — AMIODARONE HCL 200 MG PO TABS
200.0000 mg | ORAL_TABLET | Freq: Every day | ORAL | Status: DC
  Administered 2017-03-16 – 2017-03-17 (×2): 200 mg via ORAL
  Filled 2017-03-15 (×2): qty 1

## 2017-03-15 MED ORDER — BUPIVACAINE LIPOSOME 1.3 % IJ SUSP
INTRAMUSCULAR | Status: DC | PRN
  Administered 2017-03-15: 20 mL

## 2017-03-15 MED ORDER — POTASSIUM CHLORIDE CRYS ER 10 MEQ PO TBCR: 10.0000 meq | EXTENDED_RELEASE_TABLET | ORAL | Status: DC

## 2017-03-15 MED ORDER — ROCURONIUM BROMIDE 50 MG/5ML IV SOSY
PREFILLED_SYRINGE | INTRAVENOUS | Status: AC
  Filled 2017-03-15: qty 5

## 2017-03-15 MED ORDER — DEXAMETHASONE SODIUM PHOSPHATE 10 MG/ML IJ SOLN
INTRAMUSCULAR | Status: DC | PRN
  Administered 2017-03-15: 10 mg via INTRAVENOUS

## 2017-03-15 MED ORDER — LOSARTAN POTASSIUM 25 MG PO TABS
25.0000 mg | ORAL_TABLET | Freq: Every day | ORAL | Status: DC
  Administered 2017-03-15 – 2017-03-16 (×2): 25 mg via ORAL
  Filled 2017-03-15 (×2): qty 1

## 2017-03-15 MED ORDER — ROCURONIUM BROMIDE 50 MG/5ML IV SOSY
PREFILLED_SYRINGE | INTRAVENOUS | Status: AC
  Filled 2017-03-15: qty 10

## 2017-03-15 MED ORDER — ETOMIDATE 2 MG/ML IV SOLN
INTRAVENOUS | Status: AC
  Filled 2017-03-15: qty 10

## 2017-03-15 MED ORDER — ETOMIDATE 2 MG/ML IV SOLN
INTRAVENOUS | Status: DC | PRN
  Administered 2017-03-15: 24 mg via INTRAVENOUS

## 2017-03-15 MED ORDER — SENNA 8.6 MG PO TABS: 1.0000 | ORAL_TABLET | Freq: Every day | ORAL | 0 refills | Status: AC

## 2017-03-15 MED ORDER — DEXAMETHASONE SODIUM PHOSPHATE 10 MG/ML IJ SOLN
INTRAMUSCULAR | Status: AC
  Filled 2017-03-15: qty 1

## 2017-03-15 MED ORDER — ONDANSETRON HCL 4 MG/2ML IJ SOLN
INTRAMUSCULAR | Status: DC | PRN
  Administered 2017-03-15: 4 mg via INTRAVENOUS

## 2017-03-15 MED ORDER — SPIRONOLACTONE 25 MG PO TABS
12.5000 mg | ORAL_TABLET | Freq: Every day | ORAL | Status: DC
  Administered 2017-03-15 – 2017-03-16 (×2): 12.5 mg via ORAL
  Filled 2017-03-15 (×2): qty 1

## 2017-03-15 MED ORDER — LACTATED RINGERS IV SOLN
INTRAVENOUS | Status: DC | PRN
  Administered 2017-03-15: 07:00:00 via INTRAVENOUS

## 2017-03-15 MED ORDER — HYDROMORPHONE HCL-NACL 0.5-0.9 MG/ML-% IV SOSY
PREFILLED_SYRINGE | INTRAVENOUS | Status: AC
  Filled 2017-03-15: qty 1

## 2017-03-15 MED ORDER — DOCUSATE SODIUM 100 MG PO CAPS: 200.0000 mg | ORAL_CAPSULE | Freq: Two times a day (BID) | ORAL | 11 refills | Status: AC

## 2017-03-15 MED ORDER — PROMETHAZINE HCL 25 MG/ML IJ SOLN: 6.2500 mg | INTRAMUSCULAR | Status: DC | PRN

## 2017-03-15 MED ORDER — ROCURONIUM BROMIDE 100 MG/10ML IV SOLN
INTRAVENOUS | Status: DC | PRN
  Administered 2017-03-15 (×3): 20 mg via INTRAVENOUS
  Administered 2017-03-15: 50 mg via INTRAVENOUS
  Administered 2017-03-15: 20 mg via INTRAVENOUS

## 2017-03-15 MED ORDER — ONDANSETRON HCL 4 MG/2ML IJ SOLN: 4.0000 mg | INTRAMUSCULAR | Status: DC | PRN

## 2017-03-15 MED ORDER — DEXTROSE-NACL 5-0.9 % IV SOLN
INTRAVENOUS | Status: DC
  Administered 2017-03-15: 1000 mL via INTRAVENOUS
  Administered 2017-03-16: 02:00:00 via INTRAVENOUS

## 2017-03-15 MED ORDER — ACETAMINOPHEN 500 MG PO TABS
1000.0000 mg | ORAL_TABLET | Freq: Four times a day (QID) | ORAL | Status: AC
  Administered 2017-03-15 – 2017-03-16 (×4): 1000 mg via ORAL
  Filled 2017-03-15 (×4): qty 2

## 2017-03-15 MED ORDER — LABETALOL HCL 5 MG/ML IV SOLN
INTRAVENOUS | Status: DC | PRN
  Administered 2017-03-15 (×2): 10 mg via INTRAVENOUS

## 2017-03-15 MED ORDER — SUGAMMADEX SODIUM 500 MG/5ML IV SOLN
INTRAVENOUS | Status: AC
  Filled 2017-03-15: qty 5

## 2017-03-15 MED ORDER — FENTANYL CITRATE (PF) 250 MCG/5ML IJ SOLN
INTRAMUSCULAR | Status: DC | PRN
  Administered 2017-03-15 (×2): 50 ug via INTRAVENOUS
  Administered 2017-03-15: 100 ug via INTRAVENOUS
  Administered 2017-03-15: 50 ug via INTRAVENOUS

## 2017-03-15 MED ORDER — OXYCODONE HCL 5 MG PO TABS
5.0000 mg | ORAL_TABLET | ORAL | Status: DC | PRN
  Administered 2017-03-15 (×2): 5 mg via ORAL
  Filled 2017-03-15 (×2): qty 1

## 2017-03-15 MED ORDER — PROPOFOL 10 MG/ML IV BOLUS
INTRAVENOUS | Status: AC
  Filled 2017-03-15: qty 20

## 2017-03-15 MED ORDER — MIDAZOLAM HCL 2 MG/2ML IJ SOLN
INTRAMUSCULAR | Status: DC | PRN
  Administered 2017-03-15: 2 mg via INTRAVENOUS

## 2017-03-15 MED ORDER — EPHEDRINE SULFATE 50 MG/ML IJ SOLN
INTRAMUSCULAR | Status: DC | PRN
  Administered 2017-03-15: 5 mg via INTRAVENOUS
  Administered 2017-03-15: 10 mg via INTRAVENOUS
  Administered 2017-03-15 (×2): 5 mg via INTRAVENOUS
  Administered 2017-03-15: 10 mg via INTRAVENOUS
  Administered 2017-03-15: 5 mg via INTRAVENOUS
  Administered 2017-03-15: 10 mg via INTRAVENOUS
  Administered 2017-03-15: 5 mg via INTRAVENOUS
  Administered 2017-03-15 (×2): 10 mg via INTRAVENOUS

## 2017-03-15 MED ORDER — HYDROMORPHONE HCL-NACL 0.5-0.9 MG/ML-% IV SOSY
PREFILLED_SYRINGE | INTRAVENOUS | Status: AC
  Filled 2017-03-15: qty 2

## 2017-03-15 MED ORDER — SODIUM CHLORIDE 0.9 % IJ SOLN
INTRAMUSCULAR | Status: AC
  Filled 2017-03-15: qty 20

## 2017-03-15 MED ORDER — BUPIVACAINE-EPINEPHRINE (PF) 0.25% -1:200000 IJ SOLN
INTRAMUSCULAR | Status: AC
  Filled 2017-03-15: qty 30

## 2017-03-15 SURGICAL SUPPLY — 51 items
BAG LAPAROSCOPIC 12 15 PORT 16 (BASKET) ×1 IMPLANT
BAG RETRIEVAL 12/15 (BASKET) ×2
BAG ZIPLOCK 12X15 (MISCELLANEOUS) ×2 IMPLANT
BLADE EXTENDED COATED 6.5IN (ELECTRODE) ×2 IMPLANT
BLADE SURG SZ10 CARB STEEL (BLADE) IMPLANT
CATH FOLEY 3WAY  5CC 18FR (CATHETERS)
CATH FOLEY 3WAY 5CC 18FR (CATHETERS) IMPLANT
CHLORAPREP W/TINT 26ML (MISCELLANEOUS) ×2 IMPLANT
CLIP VESOLOCK LG 6/CT PURPLE (CLIP) ×4 IMPLANT
CLIP VESOLOCK MED LG 6/CT (CLIP) ×6 IMPLANT
CLIP VESOLOCK XL 6/CT (CLIP) IMPLANT
COVER SURGICAL LIGHT HANDLE (MISCELLANEOUS) ×2 IMPLANT
CUTTER FLEX LINEAR 45M (STAPLE) ×2 IMPLANT
DERMABOND ADVANCED (GAUZE/BANDAGES/DRESSINGS) ×1
DERMABOND ADVANCED .7 DNX12 (GAUZE/BANDAGES/DRESSINGS) ×1 IMPLANT
DRAIN CHANNEL 10F 3/8 F FF (DRAIN) IMPLANT
DRAPE INCISE IOBAN 66X45 STRL (DRAPES) ×2 IMPLANT
DRAPE WARM FLUID 44X44 (DRAPE) IMPLANT
DRSG TEGADERM 4X4.75 (GAUZE/BANDAGES/DRESSINGS) IMPLANT
ELECT PENCIL ROCKER SW 15FT (MISCELLANEOUS) ×2 IMPLANT
ELECT REM PT RETURN 15FT ADLT (MISCELLANEOUS) ×2 IMPLANT
EVACUATOR SILICONE 100CC (DRAIN) IMPLANT
GLOVE BIO SURGEON STRL SZ 6.5 (GLOVE) ×2 IMPLANT
GLOVE BIOGEL M STRL SZ7.5 (GLOVE) ×2 IMPLANT
GOWN STRL REUS W/TWL LRG LVL3 (GOWN DISPOSABLE) ×4 IMPLANT
HEMOSTAT SURGICEL 4X8 (HEMOSTASIS) IMPLANT
IRRIG SUCT STRYKERFLOW 2 WTIP (MISCELLANEOUS) ×2
IRRIGATION SUCT STRKRFLW 2 WTP (MISCELLANEOUS) ×1 IMPLANT
KIT BASIN OR (CUSTOM PROCEDURE TRAY) ×2 IMPLANT
POUCH SPECIMEN RETRIEVAL 10MM (ENDOMECHANICALS) IMPLANT
RELOAD 45 VASCULAR/THIN (ENDOMECHANICALS) ×2 IMPLANT
RETRACTOR LAPSCP 12X46 CVD (ENDOMECHANICALS) IMPLANT
RTRCTR LAPSCP 12X46 CVD (ENDOMECHANICALS)
SCISSORS LAP 5X35 DISP (ENDOMECHANICALS) IMPLANT
SHEARS HARMONIC ACE PLUS 36CM (ENDOMECHANICALS) ×2 IMPLANT
SURGIFLO W/THROMBIN 8M KIT (HEMOSTASIS) IMPLANT
SUT CHROMIC 2 0 SH (SUTURE) IMPLANT
SUT ETHILON 3 0 PS 1 (SUTURE) IMPLANT
SUT MNCRL AB 4-0 PS2 18 (SUTURE) ×4 IMPLANT
SUT PDS AB 1 CTX 36 (SUTURE) ×4 IMPLANT
SUT VIC AB 2-0 SH 27 (SUTURE)
SUT VIC AB 2-0 SH 27X BRD (SUTURE) IMPLANT
SUT VICRYL 0 UR6 27IN ABS (SUTURE) IMPLANT
TOWEL OR 17X26 10 PK STRL BLUE (TOWEL DISPOSABLE) ×4 IMPLANT
TRAY LAPAROSCOPIC (CUSTOM PROCEDURE TRAY) ×2 IMPLANT
TROCAR BLADELESS OPT 5 100 (ENDOMECHANICALS) ×2 IMPLANT
TROCAR XCEL 12X100 BLDLESS (ENDOMECHANICALS) ×2 IMPLANT
TROCAR XCEL BLUNT TIP 100MML (ENDOMECHANICALS) ×2 IMPLANT
TUBING INSUF HEATED (TUBING) IMPLANT
URINEMETER 200ML W/220 (MISCELLANEOUS) IMPLANT
YANKAUER SUCT BULB TIP 10FT TU (MISCELLANEOUS) ×2 IMPLANT

## 2017-03-15 NOTE — Progress Notes (Signed)
Urology Progress Note   Day of Surgery  Subjective: Doing well. Pain well controlled. Tolerating clears. Has not yet ambulated, but is motivated to do so.   Objective: Vital signs in last 24 hours: Temp:  [97.8 F (36.6 C)-98.3 F (36.8 C)] 98 F (36.7 C) (10/15 1400) Pulse Rate:  [57-72] 64 (10/15 1400) Resp:  [10-18] 12 (10/15 1400) BP: (119-143)/(74-88) 124/74 (10/15 1400) SpO2:  [98 %-100 %] 100 % (10/15 1400) Arterial Line BP: (115-138)/(65-87) 126/87 (10/15 1145) Weight:  [87.5 kg (193 lb)] 87.5 kg (193 lb) (10/15 0520)  Intake/Output from previous day: No intake/output data recorded. Intake/Output this shift: Total I/O In: 2000 [I.V.:2000] Out: 725 [Urine:675; Blood:50]  Physical Exam:  General: Alert and oriented CV: RRR Lungs: Clear Abdomen: Soft, appropriately tender. Incisions c/d/i. JP SS GU: Foley in place draining clear yellow urine  Ext: NT, No erythema  Lab Results:  Recent Labs  03/15/17 1133  HGB 12.1*  HCT 34.4*   BMET  Recent Labs  03/15/17 1133  NA 130*  K 4.4  CL 96*  CO2 23  GLUCOSE 148*  BUN 19  CREATININE 1.26*  CALCIUM 8.6*     Studies/Results: No results found.  Assessment/Plan:  61 y.o. male s/p right lap radical nephrectomy 03/15/17.  Overall doing well post-op.   - Continue to monitor overnight - Continue clears tonight - Encouraged IS and ambulation   Dispo: Home likely on Wednesday given medical comorbidities.     LOS: 0 days   Stasia Cavalier 03/15/2017, 3:30 PM

## 2017-03-15 NOTE — Discharge Summary (Signed)
[facility] Discharge Summary  Admit date: 03/15/2017  Discharge date and time: 03/17/17   Discharge to: Home  Discharge Service: Urology  Discharge Attending Physician:  Dr. Dutch Gray  Discharge  Diagnoses: Right renal mass  Secondary Diagnosis: Active Problems:   Renal mass   OR Procedures: Procedure(s): LAPAROSCOPIC  RADICAL NEPHRECTOMY 03/15/2017   Ancillary Procedures: None   Discharge Day Services: The patient was seen and examined by the Urology team both in the morning and immediately prior to discharge.  Vital signs and laboratory values were stable and within normal limits.  The physical exam was benign and unchanged and all surgical wounds were examined.  Discharge instructions were explained and all questions answered.  Subjective  No acute events overnight. Pain Controlled. No fever or chills.  Objective Patient Vitals for the past 8 hrs:  BP Temp Temp src Pulse Resp SpO2  03/17/17 0406 (!) 148/70 (!) 97.5 F (36.4 C) Oral 67 18 96 %   No intake/output data recorded.  General Appearance:        No acute distress Lungs:                       Normal work of breathing on room air Heart:                                Regular rate and rhythm Abdomen:                         Soft, non-tender, non-distended. Incisions c/d/i.  Extremities:                      Warm and well perfused   Hospital Course:  The patient underwent a laparoscopic right radical nephrectomy for a right renal mass on 03/15/2017.  The patient tolerated the procedure well, was extubated in the OR, and afterwards was taken to the PACU for routine post-surgical care. When stable the patient was transferred to the floor.   The patient did well postoperatively.  The patient's diet was slowly advanced and at the time of discharge was tolerating a regular diet.  The patient was discharged home Day of Surgery, at which point was tolerating a regular solid diet, was able to void spontaneously,  have adequate pain control with P.O. pain medication, and could ambulate without difficulty. He will restart his Eliquis on 03/18/17. The patient will follow up with [place] for post op check.   Condition at Discharge: Improved  Discharge Medications:  Allergies as of 03/17/2017   No Known Allergies     Medication List    TAKE these medications   amiodarone 200 MG tablet Commonly known as:  PACERONE Take 1 tablet (200 mg total) by mouth daily.   apixaban 5 MG Tabs tablet Commonly known as:  ELIQUIS Take 1 tablet (5 mg total) by mouth 2 (two) times daily.   docusate sodium 100 MG capsule Commonly known as:  COLACE Take 2 capsules (200 mg total) by mouth 2 (two) times daily.   furosemide 40 MG tablet Commonly known as:  LASIX Take 1 tablet (40 mg total) by mouth daily as needed (For weight gain 3 lbs or more overnight, 5 lbs or more in 1 week).   losartan 25 MG tablet Commonly known as:  COZAAR Take 1 tablet (25 mg total) by mouth at bedtime.   OIL  OF OREGANO PO Take 1 each by mouth See admin instructions. Take 1 dropper full by mouth daily   OVER THE COUNTER MEDICATION Take 1 each by mouth See admin instructions. Take 1 dropperfull by mouth daily. C02 Extracted Hemp Tincture 1000 mg   oxycodone 5 MG capsule Commonly known as:  OXY-IR Take 1-2 capsules (5-10 mg total) by mouth every 4 (four) hours as needed.   potassium chloride 10 MEQ tablet Commonly known as:  K-DUR,KLOR-CON Take 10 mEq by mouth See admin instructions. Takes with lasix   senna 8.6 MG Tabs tablet Commonly known as:  SENOKOT Take 1-2 tablets (8.6-17.2 mg total) by mouth at bedtime.   spironolactone 25 MG tablet Commonly known as:  ALDACTONE Take 0.5 tablets (12.5 mg total) by mouth at bedtime.

## 2017-03-15 NOTE — Anesthesia Procedure Notes (Signed)
Arterial Line Insertion Start/End10/15/2018 7:20 AM, 03/15/2017 7:30 AM Performed by: Hulan Fray, Emari Demmer H, CRNA  Patient location: OR. Preanesthetic checklist: patient identified, IV checked, site marked, risks and benefits discussed, surgical consent, monitors and equipment checked, pre-op evaluation and timeout performed Lidocaine 1% used for infiltration radial was placed Catheter size: 20 G Hand hygiene performed  Allen's test indicative of satisfactory collateral circulation Attempts: 2 Procedure performed without using ultrasound guided technique. Following insertion, dressing applied. Post procedure assessment: normal  Patient tolerated the procedure well with no immediate complications.

## 2017-03-15 NOTE — Interval H&P Note (Signed)
History and Physical Interval Note:  03/15/2017 6:52 AM  Theodore Weber  has presented today for surgery, with the diagnosis of RIGHT RENAL NEOPLASM  The various methods of treatment have been discussed with the patient and family. After consideration of risks, benefits and other options for treatment, the patient has consented to  Procedure(s): LAPAROSCOPIC  RADICAL NEPHRECTOMY (Right) as a surgical intervention .  The patient's history has been reviewed, patient examined, no change in status, stable for surgery.  I have reviewed the patient's chart and labs.  Questions were answered to the patient's satisfaction.     Kimila Papaleo,LES

## 2017-03-15 NOTE — Op Note (Signed)
Preoperative diagnosis: Right renal neoplasm  Postoperative diagnosis: Right renal neoplasm  Procedure: 1.  Right laparoscopic radical nephrectomy  Surgeon: Pryor Curia. M.D.  Resident: Dr. Jeralyn Ruths  Anesthesia: General  Complications: None  EBL: 100 mL  IVF:  1100 mL crystalloid  Specimens: 1. Right kidney  Disposition of specimens: Pathology  Indication: Theodore Weber is a 61 y.o. patient with a right renal tumor suspicious for malignancy.  After a thorough review of the management options for their renal mass, they elected to proceed with surgical treatment and the above procedure.  We have discussed the potential benefits and risks of the procedure, side effects of the proposed treatment, the likelihood of the patient achieving the goals of the procedure, and any potential problems that might occur during the procedure or recuperation. Informed consent has been obtained.  Description of procedure:  The patient was taken to the operating room and a general anesthetic was administered. The patient was given preoperative antibiotics, placed in the right modified flank position, and prepped and draped in the usual sterile fashion. Next a preoperative timeout was performed.  A site was selected near the umbilicus for placement of the camera port. This was placed using a standard open Hassan technique which allowed entry into the peritoneal cavity under direct vision and without difficulty. A 12 mm Hassan cannula was placed and a pneumoperitoneum established. The camera was then used to inspect the abdomen and there was no evidence of any intra-abdominal injuries or other abnormalities. The remaining abdominal ports were then placed. A 12 mm port was placed in the right lower quadrant and a 5 mm port was placed in the right upper quadrant.  All ports were placed under direct vision without difficulty.  Utilizing the harmonic scalpel, the white line of Toldt was incised  allowing the colon to be mobilized medially and the plane between the mesocolon and the anterior layer of Gerota's fascia to be developed and the kidney exposed.  The ureter and gonadal vein were identified inferiorly and the ureter was lifted anteriorly off the psoas muscle.  Dissection proceeded superiorly along the gonadal vein until the renal vein was identified.  The renal hilum was then carefully isolated with a combination of blunt and sharp dissectiong allowing the renal arterial and venous structures to be separated and isolated. There was a branching renal artery and two renal veins.  The renal artery in the lower pole was isolated and ligated with multiple Weck clips and subsequently divided.  The lower pole renal vein was also ligated with multiple Weck clips and divided.  The upper pole renal artery branch was clipped with a 5 mm clip after it was ioslated  The renal vein was then isolated and also ligated and divided with a 45 mm Flex ETS stapler.  The upper pole renal artery dissection was then completed and additional Weck clips were placed and it was divided completing the hilar dissection.  Gerota's fascia was intentionally entered superiorly and the space between the adrenal gland and the kidney was developed allowing the adrenal gland to be spared.  The hepatorenal ligaments were divided with the harmonic scalpel.  The lateral and posterior attachements to the kidney were then divided.  The ureter was ligated with Weck clips and divided allowing the specimen to be freed from all surrounding structures.  The kidney specimen was then placed into a 12 mm retrieval bag.  The renal hilum, liver, adrenal bed and gonadal vein areas were each  inspected and hemostasis was ensured with the pneomperitoneal pressures lowered.  The 12 mm lower quadrant port was then closed with a 0-vicryl suture placed laparoscopically to close the fascia of this incision. All remaining ports were removed under direct  vision.  The kidney specimen was removed intact within the retrieval bag via the camera port site after this incision was extended slightly. This fascial opening was then closed with two #1 PDS sutures.    All incisions were injected with local anesthetic and reapproximated at the skin with 4-0 monocryl sutures.  Dermabond was applied to the skin. The patient tolerated the procedure well and without complications and was transferred to the recovery unit in satisfactory condition.   Pryor Curia MD

## 2017-03-15 NOTE — Anesthesia Postprocedure Evaluation (Signed)
Anesthesia Post Note  Patient: Theodore Weber  Procedure(s) Performed: LAPAROSCOPIC  RADICAL NEPHRECTOMY (Right )     Patient location during evaluation: PACU Anesthesia Type: General Level of consciousness: awake and alert Pain management: pain level controlled Vital Signs Assessment: post-procedure vital signs reviewed and stable Respiratory status: spontaneous breathing, nonlabored ventilation, respiratory function stable and patient connected to nasal cannula oxygen Cardiovascular status: blood pressure returned to baseline and stable Postop Assessment: no apparent nausea or vomiting Anesthetic complications: no    Last Vitals:  Vitals:   03/15/17 1215 03/15/17 1300  BP:  134/78  Pulse:  63  Resp:  15  Temp: 36.8 C   SpO2:  100%    Last Pain:  Vitals:   03/15/17 1229  TempSrc:   PainSc: 6                  Durwin Davisson S

## 2017-03-15 NOTE — Anesthesia Procedure Notes (Signed)
Procedure Name: Intubation Date/Time: 03/15/2017 7:41 AM Performed by: Pilar Grammes Pre-anesthesia Checklist: Patient identified, Emergency Drugs available, Suction available, Patient being monitored and Timeout performed Patient Re-evaluated:Patient Re-evaluated prior to induction Oxygen Delivery Method: Circle system utilized Preoxygenation: Pre-oxygenation with 100% oxygen Induction Type: IV induction Ventilation: Mask ventilation without difficulty Laryngoscope Size: Miller and 2 Grade View: Grade I Tube type: Oral Tube size: 7.5 mm Number of attempts: 1 Airway Equipment and Method: Stylet Placement Confirmation: positive ETCO2,  ETT inserted through vocal cords under direct vision,  CO2 detector and breath sounds checked- equal and bilateral Secured at: 22 cm Tube secured with: Tape Dental Injury: Teeth and Oropharynx as per pre-operative assessment

## 2017-03-15 NOTE — Anesthesia Preprocedure Evaluation (Signed)
Anesthesia Evaluation  Patient identified by MRN, date of birth, ID band Patient awake    Reviewed: Allergy & Precautions, NPO status , Patient's Chart, lab work & pertinent test results  Airway Mallampati: II  TM Distance: >3 FB Neck ROM: Full    Dental no notable dental hx.    Pulmonary neg pulmonary ROS, former smoker,    Pulmonary exam normal breath sounds clear to auscultation       Cardiovascular hypertension, + dysrhythmias Atrial Fibrillation  Rhythm:Irregular Rate:Normal  Left ventricle: The cavity size was mildly dilated. Wall   thickness was normal. Indeterminant diastolic function (atrial   fibrillation). Systolic function was severely reduced. The   estimated ejection fraction was in the range of 20% to 25%.   Diffuse hypokinesis. - Aortic valve: There was no stenosis. - Aorta: Mildly dilated aortic root. Aortic root dimension: 37 mm   (ED). - Mitral valve: There was trivial regurgitation. - Left atrium: The atrium was mildly dilated. - Right ventricle: The cavity size was normal. Systolic function   was mildly reduced. - Right atrium: The atrium was mildly dilated. - Pulmonary arteries: No complete TR doppler jet so unable to   estimate PA systolic pressure. - Inferior vena cava: The vessel was normal in size. The   respirophasic diameter changes were in the normal range (>= 50%),   consistent with normal central venous pressure.  Impressions:  - The patient was in atrial fibrillation. Mildly dilated LV with   diffuse hypokinesis, EF 20-25%. Normal RV size with mildly   decreased systolic function. No significant valvular   abnormalities.   Neuro/Psych negative neurological ROS  negative psych ROS   GI/Hepatic negative GI ROS, Neg liver ROS,   Endo/Other  negative endocrine ROS  Renal/GU negative Renal ROS  negative genitourinary   Musculoskeletal negative musculoskeletal ROS (+)   Abdominal    Peds negative pediatric ROS (+)  Hematology negative hematology ROS (+)   Anesthesia Other Findings   Reproductive/Obstetrics negative OB ROS                             Anesthesia Physical Anesthesia Plan  ASA: IV  Anesthesia Plan: General   Post-op Pain Management:    Induction: Intravenous  PONV Risk Score and Plan: 2 and Ondansetron and Dexamethasone  Airway Management Planned: Oral ETT  Additional Equipment: Arterial line  Intra-op Plan:   Post-operative Plan: Extubation in OR  Informed Consent: I have reviewed the patients History and Physical, chart, labs and discussed the procedure including the risks, benefits and alternatives for the proposed anesthesia with the patient or authorized representative who has indicated his/her understanding and acceptance.   Dental advisory given  Plan Discussed with: CRNA and Surgeon  Anesthesia Plan Comments:         Anesthesia Quick Evaluation

## 2017-03-15 NOTE — Progress Notes (Signed)
Pt ambulated ~ 479ft w/ standby assist and rolling walker. He did very well and expressing desire to ambulate more often and farther distance.

## 2017-03-15 NOTE — Discharge Instructions (Addendum)
1.  Activity:  You are encouraged to ambulate frequently (about every hour during waking hours) to help prevent blood clots from forming in your legs or lungs.  However, you should not engage in any heavy lifting (> 10-15 lbs), strenuous activity, or straining. 2. Diet: You should advance your diet as instructed by your physician.  It will be normal to have some bloating, nausea, and abdominal discomfort intermittently. 3. Prescriptions: RESUME YOUR ELIQUIS ON THURSDAY.  You will be provided a prescription for pain medication to take as needed.  If your pain is not severe enough to require the prescription pain medication, you may take extra strength Tylenol instead which will have less side effects.  You should also take a prescribed stool softener to avoid straining with bowel movements as the prescription pain medication may constipate you. 4. Incisions: You may remove your dressing bandages 48 hours after surgery if not removed in the hospital.  You will either have some small staples or special tissue glue at each of the incision sites. Once the bandages are removed (if present), the incisions may stay open to air.  You may start showering (but not soaking or bathing in water) the 2nd day after surgery and the incisions simply need to be patted dry after the shower.  No additional care is needed. 5. What to call us about: You should call the office [phone]) if you develop fever > 101 or develop persistent vomiting.

## 2017-03-15 NOTE — Transfer of Care (Signed)
Immediate Anesthesia Transfer of Care Note  Patient: Theodore Weber  Procedure(s) Performed: LAPAROSCOPIC  RADICAL NEPHRECTOMY (Right )  Patient Location: PACU  Anesthesia Type:General  Level of Consciousness: alert , drowsy and patient cooperative  Airway & Oxygen Therapy: Patient connected to face mask oxygen  Post-op Assessment: Report given to RN, Post -op Vital signs reviewed and stable and Patient moving all extremities X 4  Post vital signs: stable  Last Vitals:  Vitals:   03/15/17 0505  BP: 123/76  Pulse: 60  Resp: 16  Temp: 36.6 C  SpO2: 100%    Last Pain:  Vitals:   03/15/17 0505  TempSrc: Oral      Patients Stated Pain Goal: 3 (73/42/87 6811)  Complications: No apparent anesthesia complications

## 2017-03-16 LAB — BASIC METABOLIC PANEL
Anion gap: 9 (ref 5–15)
BUN: 20 mg/dL (ref 6–20)
CO2: 26 mmol/L (ref 22–32)
Calcium: 9.1 mg/dL (ref 8.9–10.3)
Chloride: 99 mmol/L — ABNORMAL LOW (ref 101–111)
Creatinine, Ser: 1.48 mg/dL — ABNORMAL HIGH (ref 0.61–1.24)
GFR calc Af Amer: 58 mL/min — ABNORMAL LOW (ref >60)
GFR calc non Af Amer: 50 mL/min — ABNORMAL LOW (ref >60)
Glucose, Bld: 133 mg/dL — ABNORMAL HIGH (ref 65–99)
Potassium: 4.8 mmol/L (ref 3.5–5.1)
Sodium: 134 mmol/L — ABNORMAL LOW (ref 135–145)

## 2017-03-16 LAB — HEMOGLOBIN AND HEMATOCRIT, BLOOD
HCT: 34.2 % — ABNORMAL LOW (ref 39.0–52.0)
Hemoglobin: 11.6 g/dL — ABNORMAL LOW (ref 13.0–17.0)

## 2017-03-16 MED ORDER — HYDROCODONE-ACETAMINOPHEN 5-325 MG PO TABS: 1.0000 | ORAL_TABLET | Freq: Four times a day (QID) | ORAL | Status: DC | PRN

## 2017-03-16 MED ORDER — BISACODYL 10 MG RE SUPP
10.0000 mg | Freq: Once | RECTAL | Status: AC
  Administered 2017-03-16: 10 mg via RECTAL
  Filled 2017-03-16: qty 1

## 2017-03-16 MED ORDER — DOCUSATE SODIUM 100 MG PO CAPS
100.0000 mg | ORAL_CAPSULE | Freq: Two times a day (BID) | ORAL | Status: DC
  Administered 2017-03-16 – 2017-03-17 (×3): 100 mg via ORAL
  Filled 2017-03-16 (×3): qty 1

## 2017-03-16 NOTE — Addendum Note (Signed)
Addendum  created 03/16/17 0711 by Lollie Sails, CRNA   Charge Capture section accepted

## 2017-03-16 NOTE — Progress Notes (Signed)
Patient ID: KIMBERLY COYE, male   DOB: 1955-07-05, 61 y.o.   MRN: [id number]  1 Day Post-Op Subjective: Pt complains of incisional pain.  Controlled with pain meds.  No nausea.  No flatus.  No SOB or CP.  Objective: Vital signs in last 24 hours: Temp:  [97.7 F (36.5 C)-98.4 F (36.9 C)] 98.4 F (36.9 C) (10/16 0626) Pulse Rate:  [57-72] 70 (10/16 0626) Resp:  [10-18] 16 (10/16 0626) BP: (119-143)/(68-88) 133/81 (10/16 0626) SpO2:  [98 %-100 %] 98 % (10/16 0626) Arterial Line BP: (115-138)/(65-87) 126/87 (10/15 1145)  Intake/Output from previous day: 10/15 0701 - 10/16 0700 In: 3513.8 [P.O.:300; I.V.:3213.8] Out: 2025 [HBZJI:9678; Blood:50] Intake/Output this shift: No intake/output data recorded.  Physical Exam:  General: Alert and oriented CV: RRR Lungs: Clear Abdomen: Soft, ND, positive bowel sounds Incisions: C/D/I Ext: NT, No erythema  Lab Results:  Recent Labs  03/15/17 1133 03/16/17 0431  HGB 12.1* 11.6*  HCT 34.4* 34.2*   BMET  Recent Labs  03/15/17 1133 03/16/17 0431  NA 130* 134*  K 4.4 4.8  CL 96* 99*  CO2 23 26  GLUCOSE 148* 133*  BUN 19 20  CREATININE 1.26* 1.48*  CALCIUM 8.6* 9.1     Studies/Results: No results found.  Assessment/Plan: POD # 1 s/p right lap nephrectomy - Path pending - Ambulate, IS - D/C Foley - Dulcolax supp - Monitor renal function - Advance diet - SL IVF - Plan to continue to monitor with history of CHF with plans for d/c tomorrow   LOS: 1 day   Aliciana Ricciardi,LES 03/16/2017, 8:16 AM

## 2017-03-16 NOTE — Progress Notes (Signed)
Foley catheter removed at 0950. Tolerated well.

## 2017-03-16 NOTE — Progress Notes (Signed)
Patient ID: Theodore Weber, male   DOB: 1955-10-26, 61 y.o.   MRN: [id number]  Path report discussed with patient.  pT1b Nx Mx, Fuhrman grade 3 clear cell RCC with negative margins.

## 2017-03-16 NOTE — Progress Notes (Signed)
Pt ambulating with wife multiple times. Tolerating well. No pain of SOB noted.

## 2017-03-17 LAB — BASIC METABOLIC PANEL
Anion gap: 10 (ref 5–15)
BUN: 18 mg/dL (ref 6–20)
CO2: 28 mmol/L (ref 22–32)
Calcium: 9.2 mg/dL (ref 8.9–10.3)
Chloride: 94 mmol/L — ABNORMAL LOW (ref 101–111)
Creatinine, Ser: 1.57 mg/dL — ABNORMAL HIGH (ref 0.61–1.24)
GFR calc Af Amer: 54 mL/min — ABNORMAL LOW (ref >60)
GFR calc non Af Amer: 47 mL/min — ABNORMAL LOW (ref >60)
Glucose, Bld: 112 mg/dL — ABNORMAL HIGH (ref 65–99)
Potassium: 4.5 mmol/L (ref 3.5–5.1)
Sodium: 132 mmol/L — ABNORMAL LOW (ref 135–145)

## 2017-03-17 LAB — HEMOGLOBIN AND HEMATOCRIT, BLOOD
HCT: 33.6 % — ABNORMAL LOW (ref 39.0–52.0)
Hemoglobin: 11.6 g/dL — ABNORMAL LOW (ref 13.0–17.0)

## 2017-03-17 MED ORDER — ACETAMINOPHEN 325 MG PO TABS
650.0000 mg | ORAL_TABLET | Freq: Four times a day (QID) | ORAL | Status: DC | PRN
  Administered 2017-03-17: 650 mg via ORAL
  Filled 2017-03-17: qty 2

## 2017-03-17 MED ORDER — FLEET ENEMA 7-19 GM/118ML RE ENEM
1.0000 | ENEMA | Freq: Once | RECTAL | Status: AC
  Administered 2017-03-17: 1 via RECTAL
  Filled 2017-03-17: qty 1

## 2017-03-17 NOTE — Progress Notes (Signed)
D/C instructions reviewed w/ pt and wife. Both verbalize understanding and all questions answered.  Pt d/c in stable condition in w/c by NT to wife's car.  Pt in possession of d/c instructions, scripts, and all personal belongings.  

## 2017-03-17 NOTE — Progress Notes (Signed)
Patient ID: Theodore Weber, male   DOB: 1955-09-09, 61 y.o.   MRN: [id number]  2 Days Post-Op Subjective: Pt with gas pains overnight.  Unable to pass much flatus.  No nausea or vomiting.  Ambulating well.  Objective: Vital signs in last 24 hours: Temp:  [97.5 F (36.4 C)-98.4 F (36.9 C)] 97.5 F (36.4 C) (10/17 0406) Pulse Rate:  [67-73] 67 (10/17 0406) Resp:  [18] 18 (10/17 0406) BP: (125-148)/(65-79) 148/70 (10/17 0406) SpO2:  [96 %-100 %] 96 % (10/17 0406)  Intake/Output from previous day: 10/16 0701 - 10/17 0700 In: 720 [P.O.:720] Out: 2725 [Urine:2725] Intake/Output this shift: No intake/output data recorded.  Physical Exam:  General: Alert and oriented Abd: Mild distention and positive bowel sounds Inc: C/D/I  Lab Results:  Recent Labs  03/15/17 1133 03/16/17 0431 03/17/17 0420  HGB 12.1* 11.6* 11.6*  HCT 34.4* 34.2* 33.6*   BMET  Recent Labs  03/16/17 0431 03/17/17 0420  NA 134* 132*  K 4.8 4.5  CL 99* 94*  CO2 26 28  GLUCOSE 133* 112*  BUN 20 18  CREATININE 1.48* 1.57*  CALCIUM 9.1 9.2     Studies/Results: No results found.  Assessment/Plan: POD # 2 s/p right lap nephrectomy - Enema this morning - Re-evaluate for discharge later today if feeling better   LOS: 2 days   Aireona Torelli,LES 03/17/2017, 8:18 AM

## 2017-04-01 ENCOUNTER — Encounter (HOSPITAL_COMMUNITY): Payer: Self-pay | Admitting: *Deleted

## 2017-04-01 ENCOUNTER — Encounter (HOSPITAL_COMMUNITY): Payer: Self-pay | Admitting: Internal Medicine

## 2017-04-01 ENCOUNTER — Ambulatory Visit (HOSPITAL_COMMUNITY)
Admission: RE | Admit: 2017-04-01 | Discharge: 2017-04-01 | Disposition: A | Discharge status: Home / Self Care | Payer: 59 | Source: Ambulatory Visit | Attending: Internal Medicine | Admitting: Internal Medicine

## 2017-04-01 VITALS — BP 102/75 | HR 65 | Wt 187.2 lb

## 2017-04-01 DIAGNOSIS — I5022 Chronic systolic (congestive) heart failure: Secondary | ICD-10-CM | POA: Insufficient documentation

## 2017-04-01 DIAGNOSIS — I110 Hypertensive heart disease with heart failure: Secondary | ICD-10-CM | POA: Insufficient documentation

## 2017-04-01 DIAGNOSIS — I4891 Unspecified atrial fibrillation: Secondary | ICD-10-CM | POA: Insufficient documentation

## 2017-04-01 DIAGNOSIS — Z87891 Personal history of nicotine dependence: Secondary | ICD-10-CM | POA: Insufficient documentation

## 2017-04-01 DIAGNOSIS — E875 Hyperkalemia: Secondary | ICD-10-CM | POA: Insufficient documentation

## 2017-04-01 DIAGNOSIS — Z7901 Long term (current) use of anticoagulants: Secondary | ICD-10-CM | POA: Diagnosis not present

## 2017-04-01 DIAGNOSIS — I428 Other cardiomyopathies: Secondary | ICD-10-CM

## 2017-04-01 DIAGNOSIS — I472 Ventricular tachycardia: Secondary | ICD-10-CM | POA: Diagnosis not present

## 2017-04-01 DIAGNOSIS — I480 Paroxysmal atrial fibrillation: Secondary | ICD-10-CM | POA: Diagnosis not present

## 2017-04-01 DIAGNOSIS — N2889 Other specified disorders of kidney and ureter: Secondary | ICD-10-CM | POA: Diagnosis not present

## 2017-04-01 DIAGNOSIS — N179 Acute kidney failure, unspecified: Secondary | ICD-10-CM | POA: Diagnosis not present

## 2017-04-01 DIAGNOSIS — F1010 Alcohol abuse, uncomplicated: Secondary | ICD-10-CM | POA: Insufficient documentation

## 2017-04-01 DIAGNOSIS — Z905 Acquired absence of kidney: Secondary | ICD-10-CM | POA: Diagnosis not present

## 2017-04-01 DIAGNOSIS — Z79899 Other long term (current) drug therapy: Secondary | ICD-10-CM | POA: Insufficient documentation

## 2017-04-01 NOTE — Progress Notes (Signed)
Advanced Heart Failure Clinic Note   Referring Physician: Dr. Gwenlyn Found Primary Care: No PCP Primary Cardiologist: Dr. Avelina Laine. [name]   HPI: Theodore Weber is a 61 y.o. male with past medical history of R renal mass, HTN, and ETOH abuse and chronic systolic CHF (EF 35-32%).   He was admitted 01/13/17-01/16/17 to [facility] as a transfer from [facility] with a several day history of flank pain. CT and US revealed R renal mass concerning for malignancy. Plan was for an elective R nephrectomy. On presentation to the OR he was in Afib RVR, so case was cancelled. He was started on cardizem and heparin gtts. Underwent nuclear stress test that showed no ischemia, severe LV HK with EF estimated 12%. Echo with EF 20-25%. He underwent TEE/DCCV, with return to NSR. He was started on Eliquis and amiodarone. He was seen by EP who recommended medical therapy pending cancer prognosis prior to considering ICD. Surgery for R nephrectomy is schedule for Dec. 2018. He was seen by the CHF team and started on Digoxin, losartan and Spiro. Noted to have very heavy ETOH use. Discharge weight 183 pounds.   Recently admitted 10/15 - 03/17/17 for Right Radical Nephrectomy 03/15/17  Today he returns for follow up. Feeling very good overall since after surgery. Denies SOB. HR at home has been stable 70s. Blood pressures has been 90s with occasional upper 80s. Occasional lightheadedness with rapid standing but no syncope or presyncope. Walks 3.5 miles most days in 45-50 minutes. No alcohol since August. Taking all medications as directed. Weight at home around 178-180 lbs. Has only taken lasix once every 3-4 weeks. Otherwise has been stable. No bleeding on Eliquis.   Bedside Echo 02/17/17 EF 30-35%  Past Medical History:  Diagnosis Date  . Atrial fibrillation ([place]) 12/2016  . Hypertension   . Renal mass 12/2016  . Systolic dysfunction with acute on chronic heart failure ([place]) 01/15/2017    Current Outpatient  Prescriptions  Medication Sig Dispense Refill  . amiodarone (PACERONE) 200 MG tablet Take 1 tablet (200 mg total) by mouth daily. 30 tablet 6  . apixaban (ELIQUIS) 5 MG TABS tablet Take 1 tablet (5 mg total) by mouth 2 (two) times daily. 60 tablet 11  . docusate sodium (COLACE) 100 MG capsule Take 2 capsules (200 mg total) by mouth 2 (two) times daily. 120 capsule 11  . furosemide (LASIX) 40 MG tablet Take 1 tablet (40 mg total) by mouth daily as needed (For weight gain 3 lbs or more overnight, 5 lbs or more in 1 week). 30 tablet 6  . losartan (COZAAR) 25 MG tablet Take 1 tablet (25 mg total) by mouth at bedtime. 30 tablet 11  . OIL OF OREGANO PO Take 1 each by mouth See admin instructions. Take 1 dropper full by mouth daily    . OVER THE COUNTER MEDICATION Take 1 each by mouth See admin instructions. Take 1 dropperfull by mouth daily. C02 Extracted Hemp Tincture 1000 mg    . oxycodone (OXY-IR) 5 MG capsule Take 1-2 capsules (5-10 mg total) by mouth every 4 (four) hours as needed. 20 capsule 0  . potassium chloride (K-DUR,KLOR-CON) 10 MEQ tablet Take 10 mEq by mouth See admin instructions. Takes with lasix    . senna (SENOKOT) 8.6 MG TABS tablet Take 1-2 tablets (8.6-17.2 mg total) by mouth at bedtime. 60 tablet 0  . spironolactone (ALDACTONE) 25 MG tablet Take 0.5 tablets (12.5 mg total) by mouth at bedtime. 15 tablet 11   No  current facility-administered medications for this encounter.     No Known Allergies    Social History   Social History  . Marital status: Married    Spouse name: N/A  . Number of children: N/A  . Years of education: N/A   Occupational History  . Not on file.   Social History Main Topics  . Smoking status: Former Smoker    Types: Cigars    Quit date: 01/09/2017  . Smokeless tobacco: Current User  . Alcohol use 1.8 oz/week    3 Cans of beer per week     Comment: daily- Pt reports that last drink was 01-09-17  . Drug use: No  . Sexual activity: Not on file     Other Topics Concern  . Not on file   Social History Narrative  . No narrative on file   Family History  Problem Relation Age of Onset  . Hypertension Father    Vitals:   04/01/17 1031  BP: 102/75  Pulse: 65  SpO2: 100%  Weight: 187 lb 4 oz (84.9 kg)   Wt Readings from Last 3 Encounters:  04/01/17 187 lb 4 oz (84.9 kg)  03/15/17 193 lb (87.5 kg)  03/11/17 193 lb (87.5 kg)   PHYSICAL EXAM: General: Well appearing. No resp difficulty. HEENT: Normal Neck: Supple. JVP 5-6. Carotids 2+ bilat; no bruits. No thyromegaly or nodule noted. Cor: PMI nondisplaced. RRR, No M/G/R noted Lungs: Clear mildly diminished Abdomen: Soft, non-tender, non-distended, no HSM. No bruits or masses. +BS. Laparoscopic wounds well healed, C/D/I. Extremities: No cyanosis, clubbing, or rash. R and LLE no edema.  Neuro: Alert & orientedx3, cranial nerves grossly intact. moves all 4 extremities w/o difficulty. Affect pleasant   ASSESSMENT & PLAN: 1. Chronic systolic CHF due to NICM: Echo 01/13/17 LVEF 20-25%, trivial MR, mild LAE, RV mildly elevated, mild RAE. R/L heart cath Aug. 2016 with normal cors. CM felt to be related to ETOH and tachy-mediated. cMRI with EF 24%.  - Dr. [name] performed portable ECHO at last visit with EF improving 30-35%.   - NYHA I.  - Volume status stable on exam. No need for lasix at this tpoint - Continue spiro 12.5 mg qhs - Continue losartan 25 mg qhs. No room to switch to [facility] .  - No BB with soft bp and low heart rate.  - Repeat Echo. Will need to see EP for ICD if EF does not recover.   2. Atrial fibrillation: s/p DCCV - Regular pulse.  - Continue amiodarone 200 mg daily.  - CHA2DS2/VASc at least 2.  - Continue Eliquis 5 mg BID.   3. NSVT - Continue amiodarone 200 mg daily.   4. R Renal mass - s/p R renal nephrectomy 03/15/17.   5. Alcohol Abuse - Abstinent. No change.   6. AKI- - Creatinine 1.57. On 03/17/17 s/p Nephrectomy. BMET today.   7.  Hyperkalemia- BMET today.   No room to uptitrate meds with soft pressures. Echo in next few weeks. Follow up 2-3 months, sooner with symptoms.   Shirley Friar, PA-C 04/01/17   Patient seen and examined with the above-signed Advanced Practice Provider and/or Housestaff. I personally reviewed laboratory data, imaging studies and relevant notes. I independently examined the patient and formulated the important aspects of the plan. I have edited the note to reflect any of my changes or salient points. I have personally discussed the plan with the patient and/or family.  Doing well. Maintaining NSR on amio. No bleeding  on Eliquis.  HF continues to improve. Now NYHA I Will repeat echo next visit. BP too low to titrate HF meds. Congratulated him on abstinence from ETOH. Tolerated R nephrectomy well. f EF recovers completely would consider switching amio to flecainide to avoid lhe risks of long-term amio exposure.   Glori Bickers, MD  11:45 PM

## 2017-04-01 NOTE — Patient Instructions (Signed)
Your physician has requested that you have an echocardiogram. Echocardiography is a painless test that uses sound waves to create images of your heart. It provides your doctor with information about the size and shape of your heart and how well your heart's chambers and valves are working. This procedure takes approximately one hour. There are no restrictions for this procedure.  Your physician recommends that you schedule a follow-up appointment in: 2-3 weeks with Dr. [name]

## 2017-04-06 ENCOUNTER — Other Ambulatory Visit: Payer: Self-pay

## 2017-04-06 ENCOUNTER — Ambulatory Visit (HOSPITAL_COMMUNITY): Payer: 59 | Attending: Cardiovascular Disease

## 2017-04-06 DIAGNOSIS — I5022 Chronic systolic (congestive) heart failure: Secondary | ICD-10-CM

## 2017-04-06 DIAGNOSIS — Z905 Acquired absence of kidney: Secondary | ICD-10-CM | POA: Diagnosis not present

## 2017-04-06 DIAGNOSIS — I110 Hypertensive heart disease with heart failure: Secondary | ICD-10-CM | POA: Insufficient documentation

## 2017-04-06 DIAGNOSIS — F1011 Alcohol abuse, in remission: Secondary | ICD-10-CM | POA: Insufficient documentation

## 2017-04-06 DIAGNOSIS — Z87891 Personal history of nicotine dependence: Secondary | ICD-10-CM | POA: Insufficient documentation

## 2017-04-06 DIAGNOSIS — I4891 Unspecified atrial fibrillation: Secondary | ICD-10-CM | POA: Insufficient documentation

## 2017-06-09 ENCOUNTER — Telehealth (HOSPITAL_COMMUNITY): Payer: Self-pay | Admitting: Vascular Surgery

## 2017-06-09 NOTE — Telephone Encounter (Signed)
Spoke to pt wife to reschedule appt w/ db due to db not being in office 1/31, pt was rescheduled 2/6 @ 1:40 pm , pt wife voiced this appt will be ok

## 2017-07-01 ENCOUNTER — Encounter (HOSPITAL_COMMUNITY): Payer: 59 | Admitting: Internal Medicine

## 2017-07-07 ENCOUNTER — Encounter (HOSPITAL_COMMUNITY): Payer: Self-pay | Admitting: Internal Medicine

## 2017-07-07 ENCOUNTER — Ambulatory Visit (HOSPITAL_COMMUNITY)
Admission: RE | Admit: 2017-07-07 | Discharge: 2017-07-07 | Disposition: A | Discharge status: Home / Self Care | Payer: BLUE CROSS/BLUE SHIELD | Source: Ambulatory Visit | Attending: Internal Medicine | Admitting: Internal Medicine

## 2017-07-07 VITALS — BP 84/61 | HR 69 | Wt 193.4 lb

## 2017-07-07 DIAGNOSIS — F1010 Alcohol abuse, uncomplicated: Secondary | ICD-10-CM | POA: Diagnosis not present

## 2017-07-07 DIAGNOSIS — I480 Paroxysmal atrial fibrillation: Secondary | ICD-10-CM

## 2017-07-07 DIAGNOSIS — Z87891 Personal history of nicotine dependence: Secondary | ICD-10-CM | POA: Diagnosis not present

## 2017-07-07 DIAGNOSIS — I4891 Unspecified atrial fibrillation: Secondary | ICD-10-CM | POA: Diagnosis not present

## 2017-07-07 DIAGNOSIS — I472 Ventricular tachycardia: Secondary | ICD-10-CM | POA: Insufficient documentation

## 2017-07-07 DIAGNOSIS — Z7901 Long term (current) use of anticoagulants: Secondary | ICD-10-CM | POA: Insufficient documentation

## 2017-07-07 DIAGNOSIS — Z8249 Family history of ischemic heart disease and other diseases of the circulatory system: Secondary | ICD-10-CM | POA: Insufficient documentation

## 2017-07-07 DIAGNOSIS — N2889 Other specified disorders of kidney and ureter: Secondary | ICD-10-CM | POA: Diagnosis not present

## 2017-07-07 DIAGNOSIS — I429 Cardiomyopathy, unspecified: Secondary | ICD-10-CM | POA: Diagnosis not present

## 2017-07-07 DIAGNOSIS — Z79899 Other long term (current) drug therapy: Secondary | ICD-10-CM | POA: Diagnosis not present

## 2017-07-07 DIAGNOSIS — Z905 Acquired absence of kidney: Secondary | ICD-10-CM | POA: Diagnosis not present

## 2017-07-07 DIAGNOSIS — N179 Acute kidney failure, unspecified: Secondary | ICD-10-CM | POA: Diagnosis not present

## 2017-07-07 DIAGNOSIS — E875 Hyperkalemia: Secondary | ICD-10-CM | POA: Diagnosis not present

## 2017-07-07 DIAGNOSIS — I110 Hypertensive heart disease with heart failure: Secondary | ICD-10-CM | POA: Diagnosis not present

## 2017-07-07 DIAGNOSIS — I5022 Chronic systolic (congestive) heart failure: Secondary | ICD-10-CM | POA: Diagnosis not present

## 2017-07-07 DIAGNOSIS — Z79891 Long term (current) use of opiate analgesic: Secondary | ICD-10-CM | POA: Diagnosis not present

## 2017-07-07 LAB — BASIC METABOLIC PANEL
Anion gap: 9 (ref 5–15)
BUN: 32 mg/dL — ABNORMAL HIGH (ref 6–20)
CO2: 25 mmol/L (ref 22–32)
Calcium: 9.2 mg/dL (ref 8.9–10.3)
Chloride: 96 mmol/L — ABNORMAL LOW (ref 101–111)
Creatinine, Ser: 1.74 mg/dL — ABNORMAL HIGH (ref 0.61–1.24)
GFR calc Af Amer: 47 mL/min — ABNORMAL LOW (ref >60)
GFR calc non Af Amer: 41 mL/min — ABNORMAL LOW (ref >60)
Glucose, Bld: 88 mg/dL (ref 65–99)
Potassium: 4.5 mmol/L (ref 3.5–5.1)
Sodium: 130 mmol/L — ABNORMAL LOW (ref 135–145)

## 2017-07-07 NOTE — Addendum Note (Signed)
Encounter addended by: Scarlette Calico, RN on: 07/07/2017 2:36 PM  Actions taken: Order list changed, Diagnosis association updated, Sign clinical note

## 2017-07-07 NOTE — Patient Instructions (Signed)
Labs today  Liberalize salt and fluid intake, stay below 2000 mg of salt and 2 Liter of fluid daily  Your physician recommends that you schedule a follow-up appointment in: 3-4 months

## 2017-07-07 NOTE — Progress Notes (Signed)
Advanced Heart Failure Clinic Note   Referring Physician: Dr. Gwenlyn Found Primary Care: No PCP Primary Cardiologist: Dr. Avelina Laine. [name]   HPI: Theodore Weber is a 62 y.o. male with past medical history of R renal mass, HTN, and ETOH abuse and chronic systolic CHF (EF 46-96%).   He was admitted 01/13/17-01/16/17 to [facility] as a transfer from [facility] with a several day history of flank pain. CT and US revealed R renal mass concerning for malignancy. Plan was for an elective R nephrectomy. On presentation to the OR he was in Afib RVR, so case was cancelled. He was started on cardizem and heparin gtts. Underwent nuclear stress test that showed no ischemia, severe LV HK with EF estimated 12%. Echo with EF 20-25%. He underwent TEE/DCCV, with return to NSR. He was started on Eliquis and amiodarone. He was seen by EP who recommended medical therapy pending cancer prognosis prior to considering ICD. Surgery for R nephrectomy is schedule for Dec. 2018. He was seen by the CHF team and started on Digoxin, losartan and Spiro. Noted to have very heavy ETOH use. Discharge weight 183 pounds.   Recently admitted 10/15 - 03/17/17 for Right Radical Nephrectomy 03/15/17  He presents today for regular follow up. Feeling well overall, just fatigued at work. Walking 3.5 miles a day in 45-50 minutes. He also walks around 3 miles on the floor at work. Occasional lightheadedness with rapid standing but not marked or limiting. Hasn't needed any lasix in several months. Denies BRBPR, melena.    Feeling very good overall since after surgery. Denies SOB. HR at home has been stable 70s. Blood pressures has been 90s with occasional upper 80s. Occasional lightheadedness with rapid standing but no syncope or presyncope. Walks 3.5 miles most days in 45-50 minutes. No alcohol since August. Taking all medications as directed. Weight at home around 178-180 lbs. Has only taken lasix once every 3-4 weeks. Otherwise has been stable.  No bleeding on Eliquis.   Bedside Echo 02/17/17 EF 30-35%  - Echo 04/2017 LVEF 40-45%, Grade 2 DD - > Much improved from previous.   Bedside echo today EF 45-50%   Past Medical History:  Diagnosis Date  . Atrial fibrillation ([place]) 12/2016  . Hypertension   . Renal mass 12/2016  . Systolic dysfunction with acute on chronic heart failure ([place]) 01/15/2017    Current Outpatient Medications  Medication Sig Dispense Refill  . amiodarone (PACERONE) 200 MG tablet Take 1 tablet (200 mg total) by mouth daily. 30 tablet 6  . apixaban (ELIQUIS) 5 MG TABS tablet Take 1 tablet (5 mg total) by mouth 2 (two) times daily. 60 tablet 11  . docusate sodium (COLACE) 100 MG capsule Take 2 capsules (200 mg total) by mouth 2 (two) times daily. 120 capsule 11  . losartan (COZAAR) 25 MG tablet Take 1 tablet (25 mg total) by mouth at bedtime. 30 tablet 11  . OIL OF OREGANO PO Take 1 each by mouth See admin instructions. Take 1 dropper full by mouth daily    . OVER THE COUNTER MEDICATION Take 1 each by mouth See admin instructions. Take 1 dropperfull by mouth daily. C02 Extracted Hemp Tincture 1000 mg    . oxycodone (OXY-IR) 5 MG capsule Take 1-2 capsules (5-10 mg total) by mouth every 4 (four) hours as needed. 20 capsule 0  . potassium chloride (K-DUR,KLOR-CON) 10 MEQ tablet Take 10 mEq by mouth See admin instructions. Takes with lasix    . spironolactone (ALDACTONE) 25 MG tablet  Take 0.5 tablets (12.5 mg total) by mouth at bedtime. 15 tablet 11  . furosemide (LASIX) 40 MG tablet Take 1 tablet (40 mg total) by mouth daily as needed (For weight gain 3 lbs or more overnight, 5 lbs or more in 1 week). (Patient not taking: Reported on 07/07/2017) 30 tablet 6   No current facility-administered medications for this encounter.     No Known Allergies    Social History   Socioeconomic History  . Marital status: Married    Spouse name: Not on file  . Number of children: Not on file  . Years of education: Not on  file  . Highest education level: Not on file  Social Needs  . Financial resource strain: Not on file  . Food insecurity - worry: Not on file  . Food insecurity - inability: Not on file  . Transportation needs - medical: Not on file  . Transportation needs - non-medical: Not on file  Occupational History  . Not on file  Tobacco Use  . Smoking status: Former Smoker    Types: Cigars    Last attempt to quit: 01/09/2017    Years since quitting: 0.4  . Smokeless tobacco: Current User  Substance and Sexual Activity  . Alcohol use: Yes    Alcohol/week: 1.8 oz    Types: 3 Cans of beer per week    Comment: daily- Pt reports that last drink was 01-09-17  . Drug use: No  . Sexual activity: Not on file  Other Topics Concern  . Not on file  Social History Narrative  . Not on file   Family History  Problem Relation Age of Onset  . Hypertension Father    Vitals:   07/07/17 1349  BP: (!) 84/61  Pulse: 69  SpO2: 100%  Weight: 193 lb 6.4 oz (87.7 kg)   Orthostatics Left Arm Sitting 112/86 Standing 108/84  Wt Readings from Last 3 Encounters:  07/07/17 193 lb 6.4 oz (87.7 kg)  04/01/17 187 lb 4 oz (84.9 kg)  03/15/17 193 lb (87.5 kg)   PHYSICAL EXAM: General: Well appearing. No resp difficulty. HEENT: Normal aniceric Neck: Supple. JVP flat. Carotids 2+ bilat; no bruits. No thyromegaly or nodule noted. Cor: PMI nondisplaced. RRR, No M/G/R noted Lungs: Clear mildly diminished  No wheeze Abdomen: Soft, non-tender, non-distended, no HSM. No bruits or masses. +BS. Laparoscopic wounds well healed, C/D/I. Extremities: No cyanosis, clubbing, or rash. R and LLE no edema.  Neuro: Alert & orientedx3, cranial nerves grossly intact. moves all 4 extremities w/o difficulty. Affect pleasant   ASSESSMENT & PLAN: 1. Chronic systolic CHF due to NICM: Echo 01/13/17 LVEF 20-25%, trivial MR, mild LAE, RV mildly elevated, mild RAE. R/L heart cath Aug. 2016 with normal cors. CM felt to be related to  ETOH and tachy-mediated. cMRI with EF 24%.  - Echo 04/2017 LVEF 40-45%, Grade 2 DD - > Much improved from previous.  - NYHA I.  - Volume status stable on exam. No need for lasix at this tpoint - Continue spiro 12.5 mg qhs - Continue losartan 25 mg qhs. No room to switch to [facility] .  - No BB with soft bp and low heart rate.    2. Atrial fibrillation: s/p DCCV 8/18 - Regular pulse.  - Continue amiodarone 200 mg daily.  - CHA2DS2/VASc at least 2.  - Continue Eliquis 5 mg BID.   3. NSVT - Continue amiodarone 200 mg daily.   4. R Renal mass - s/p R  renal nephrectomy 03/15/17.   5. Alcohol Abuse - Abstinent. No change.   6. AKI- - Creatinine 1.57. On 03/17/17 s/p Nephrectomy. BMET today.   7. Hyperkalemia- BMET today.   No room to uptitrate meds with soft pressures. Echo in next few weeks. Follow up 2-3 months, sooner with symptoms.   Shirley Friar, PA-C 07/07/17   Patient seen and examined with the above-signed Advanced Practice Provider and/or Housestaff. I personally reviewed laboratory data, imaging studies and relevant notes. I independently examined the patient and formulated the important aspects of the plan. I have edited the note to reflect any of my changes or salient points. I have personally discussed the plan with the patient and/or family.  Overall doing well. EF on echo today 45-50%. Maintaining NSR on amio. NYHA I-II. BP soft. No need for lasix. I have encouraged to liberalize his salt and fluid intake to avoid volume depletion .  Glori Bickers, MD  2:30 PM

## 2017-07-28 DIAGNOSIS — Z6825 Body mass index (BMI) 25.0-25.9, adult: Secondary | ICD-10-CM | POA: Diagnosis not present

## 2017-07-28 DIAGNOSIS — M26609 Unspecified temporomandibular joint disorder, unspecified side: Secondary | ICD-10-CM | POA: Diagnosis not present

## 2017-09-29 ENCOUNTER — Other Ambulatory Visit (HOSPITAL_COMMUNITY): Payer: Self-pay | Admitting: Urology

## 2017-09-29 ENCOUNTER — Ambulatory Visit (HOSPITAL_COMMUNITY)
Admission: RE | Admit: 2017-09-29 | Discharge: 2017-09-29 | Disposition: A | Discharge status: Home / Self Care | Payer: BLUE CROSS/BLUE SHIELD | Source: Ambulatory Visit | Attending: Urology | Admitting: Urology

## 2017-09-29 DIAGNOSIS — J984 Other disorders of lung: Secondary | ICD-10-CM | POA: Diagnosis not present

## 2017-09-29 DIAGNOSIS — C641 Malignant neoplasm of right kidney, except renal pelvis: Secondary | ICD-10-CM

## 2017-09-29 DIAGNOSIS — R918 Other nonspecific abnormal finding of lung field: Secondary | ICD-10-CM | POA: Diagnosis not present

## 2017-10-01 ENCOUNTER — Other Ambulatory Visit (HOSPITAL_COMMUNITY): Payer: Self-pay | Admitting: Urology

## 2017-10-01 ENCOUNTER — Ambulatory Visit (HOSPITAL_COMMUNITY)
Admission: RE | Admit: 2017-10-01 | Discharge: 2017-10-01 | Disposition: A | Discharge status: Home / Self Care | Payer: BLUE CROSS/BLUE SHIELD | Source: Ambulatory Visit | Attending: Urology | Admitting: Urology

## 2017-10-01 DIAGNOSIS — C641 Malignant neoplasm of right kidney, except renal pelvis: Secondary | ICD-10-CM | POA: Insufficient documentation

## 2017-10-01 DIAGNOSIS — R918 Other nonspecific abnormal finding of lung field: Secondary | ICD-10-CM | POA: Diagnosis not present

## 2017-10-04 ENCOUNTER — Ambulatory Visit (HOSPITAL_COMMUNITY)
Admission: RE | Admit: 2017-10-04 | Discharge: 2017-10-04 | Disposition: A | Discharge status: Home / Self Care | Payer: BLUE CROSS/BLUE SHIELD | Source: Ambulatory Visit | Attending: Internal Medicine | Admitting: Internal Medicine

## 2017-10-04 VITALS — BP 128/82 | HR 63 | Wt 195.8 lb

## 2017-10-04 DIAGNOSIS — I130 Hypertensive heart and chronic kidney disease with heart failure and stage 1 through stage 4 chronic kidney disease, or unspecified chronic kidney disease: Secondary | ICD-10-CM | POA: Insufficient documentation

## 2017-10-04 DIAGNOSIS — Z905 Acquired absence of kidney: Secondary | ICD-10-CM | POA: Diagnosis not present

## 2017-10-04 DIAGNOSIS — I5022 Chronic systolic (congestive) heart failure: Secondary | ICD-10-CM | POA: Diagnosis not present

## 2017-10-04 DIAGNOSIS — N2889 Other specified disorders of kidney and ureter: Secondary | ICD-10-CM | POA: Diagnosis not present

## 2017-10-04 DIAGNOSIS — F1010 Alcohol abuse, uncomplicated: Secondary | ICD-10-CM

## 2017-10-04 DIAGNOSIS — Z7901 Long term (current) use of anticoagulants: Secondary | ICD-10-CM | POA: Insufficient documentation

## 2017-10-04 DIAGNOSIS — Z87891 Personal history of nicotine dependence: Secondary | ICD-10-CM | POA: Insufficient documentation

## 2017-10-04 DIAGNOSIS — I480 Paroxysmal atrial fibrillation: Secondary | ICD-10-CM | POA: Diagnosis not present

## 2017-10-04 DIAGNOSIS — Z79899 Other long term (current) drug therapy: Secondary | ICD-10-CM | POA: Insufficient documentation

## 2017-10-04 DIAGNOSIS — I472 Ventricular tachycardia: Secondary | ICD-10-CM | POA: Diagnosis not present

## 2017-10-04 DIAGNOSIS — N189 Chronic kidney disease, unspecified: Secondary | ICD-10-CM | POA: Diagnosis not present

## 2017-10-04 DIAGNOSIS — Z8249 Family history of ischemic heart disease and other diseases of the circulatory system: Secondary | ICD-10-CM | POA: Insufficient documentation

## 2017-10-04 DIAGNOSIS — I4891 Unspecified atrial fibrillation: Secondary | ICD-10-CM | POA: Diagnosis not present

## 2017-10-04 DIAGNOSIS — I428 Other cardiomyopathies: Secondary | ICD-10-CM | POA: Diagnosis not present

## 2017-10-04 DIAGNOSIS — I4729 Other ventricular tachycardia: Secondary | ICD-10-CM

## 2017-10-04 LAB — HEPATIC FUNCTION PANEL
ALT: 19 U/L (ref 17–63)
AST: 17 U/L (ref 15–41)
Albumin: 4.1 g/dL (ref 3.5–5.0)
Alkaline Phosphatase: 46 U/L (ref 38–126)
Bilirubin, Direct: <0.1 mg/dL — ABNORMAL LOW (ref 0.1–0.5)
Total Bilirubin: 0.7 mg/dL (ref 0.3–1.2)
Total Protein: 6.8 g/dL (ref 6.5–8.1)

## 2017-10-04 LAB — TSH: TSH: 21.223 u[IU]/mL — ABNORMAL HIGH (ref 0.350–4.500)

## 2017-10-04 MED ORDER — AMIODARONE HCL 100 MG PO TABS: 100.0000 mg | ORAL_TABLET | Freq: Every day | ORAL | 3 refills | Status: DC

## 2017-10-04 NOTE — Progress Notes (Signed)
Advanced Heart Failure Clinic Note   Referring Physician: Dr. Gwenlyn Weber Primary Care: No PCP Primary Cardiologist: Dr. Avelina Weber. Theodore Weber   HPI: Theodore Weber is a 62 y.o. male with past medical history of R renal mass, HTN, and ETOH abuse and chronic systolic CHF (EF 63-14%). S/p R nephrectomy 03/2017.   He was admitted 01/13/17-01/16/17 to [facility] as a transfer from [facility] with a several day history of flank pain. CT and US revealed R renal mass concerning for malignancy. Plan was for an elective R nephrectomy. On presentation to the OR he was in Afib RVR, so case was cancelled. He was started on cardizem and heparin gtts. Underwent nuclear stress test that showed no ischemia, severe LV HK with EF estimated 12%. Echo with EF 20-25%. He underwent TEE/DCCV, with return to NSR. He was started on Eliquis and amiodarone. He was seen by EP who recommended medical therapy pending cancer prognosis prior to considering ICD. Surgery for R nephrectomy is schedule for Dec. 2018. He was seen by the CHF team and started on Digoxin, losartan and Spiro. Noted to have very heavy ETOH use. Discharge weight 183 pounds.   Recently admitted 10/15 - 03/17/17 for Right Radical Nephrectomy 03/15/17  Today he returns for HF follow up. Overall feeling fine. Denies SOB/PND/Orthopnea. No bleeding problems. Appetite ok. No fever or chills. Walking 3 miles a day. Rarely drinks alcohol. Weight at home has been stable. Taking all medications. Working full time.   ECHO 04/06/2017 EF 40-45% Grade II DD Bedside Echo 02/17/17 EF 30-35%  Past Medical History:  Diagnosis Date  . Atrial fibrillation ([place]) 12/2016  . Hypertension   . Renal mass 12/2016  . Systolic dysfunction with acute on chronic heart failure ([place]) 01/15/2017    Current Outpatient Medications  Medication Sig Dispense Refill  . amiodarone (PACERONE) 200 MG tablet Take 1 tablet (200 mg total) by mouth daily. 30 tablet 6  . apixaban (ELIQUIS) 5 MG TABS  tablet Take 1 tablet (5 mg total) by mouth 2 (two) times daily. 60 tablet 11  . losartan (COZAAR) 25 MG tablet Take 1 tablet (25 mg total) by mouth at bedtime. 30 tablet 11  . OIL OF OREGANO PO Take 1 each by mouth See admin instructions. Take 1 dropper full by mouth daily    . OVER THE COUNTER MEDICATION Take 1 each by mouth See admin instructions. Take 1 dropperfull by mouth daily. C02 Extracted Hemp Tincture 1000 mg    . spironolactone (ALDACTONE) 25 MG tablet Take 0.5 tablets (12.5 mg total) by mouth at bedtime. 15 tablet 11  . docusate sodium (COLACE) 100 MG capsule Take 2 capsules (200 mg total) by mouth 2 (two) times daily. (Patient not taking: Reported on 10/04/2017) 120 capsule 11  . furosemide (LASIX) 40 MG tablet Take 1 tablet (40 mg total) by mouth daily as needed (For weight gain 3 lbs or more overnight, 5 lbs or more in 1 week). (Patient not taking: Reported on 07/07/2017) 30 tablet 6  . potassium chloride (K-DUR,KLOR-CON) 10 MEQ tablet Take 10 mEq by mouth See admin instructions. Takes with lasix     No current facility-administered medications for this encounter.     No Known Allergies    Social History   Socioeconomic History  . Marital status: Married    Spouse name: Not on file  . Number of children: Not on file  . Years of education: Not on file  . Highest education level: Not on file  Occupational  History  . Not on file  Social Needs  . Financial resource strain: Not on file  . Food insecurity:    Worry: Not on file    Inability: Not on file  . Transportation needs:    Medical: Not on file    Non-medical: Not on file  Tobacco Use  . Smoking status: Former Smoker    Types: Cigars    Last attempt to quit: 01/09/2017    Years since quitting: 0.7  . Smokeless tobacco: Current User  Substance and Sexual Activity  . Alcohol use: Yes    Alcohol/week: 1.8 oz    Types: 3 Cans of beer per week    Comment: daily- Pt reports that last drink was 01-09-17  . Drug use: No    . Sexual activity: Not on file  Lifestyle  . Physical activity:    Days per week: Not on file    Minutes per session: Not on file  . Stress: Not on file  Relationships  . Social connections:    Talks on phone: Not on file    Gets together: Not on file    Attends religious service: Not on file    Active member of club or organization: Not on file    Attends meetings of clubs or organizations: Not on file    Relationship status: Not on file  . Intimate partner violence:    Fear of current or ex partner: Not on file    Emotionally abused: Not on file    Physically abused: Not on file    Forced sexual activity: Not on file  Other Topics Concern  . Not on file  Social History Narrative  . Not on file   Family History  Problem Relation Age of Onset  . Hypertension Father    Vitals:   10/04/17 1505  BP: 128/82  Pulse: 63  SpO2: 99%  Weight: 195 lb 12.8 oz (88.8 kg)   Wt Readings from Last 3 Encounters:  10/04/17 195 lb 12.8 oz (88.8 kg)  07/07/17 193 lb 6.4 oz (87.7 kg)  04/01/17 187 lb 4 oz (84.9 kg)   PHYSICAL EXAM: General:  Well appearing. No resp difficulty. Walked in the clinic without difficulty.  HEENT: normal Neck: supple. no JVD. Carotids 2+ bilat; no bruits. No lymphadenopathy or thryomegaly appreciated. Cor: PMI nondisplaced. Regular rate & rhythm. No rubs, gallops or murmurs. Lungs: clear Abdomen: soft, nontender, nondistended. No hepatosplenomegaly. No bruits or masses. Good bowel sounds. Extremities: no cyanosis, clubbing, rash, edema Neuro: alert & orientedx3, cranial nerves grossly intact. moves all 4 extremities w/o difficulty. Affect pleasant  ASSESSMENT & PLAN: 1. Chronic systolic CHF due to NICM: Echo 01/13/17 LVEF 20-25%, trivial MR, mild LAE, RV mildly elevated, mild RAE. R/L heart cath Aug. 2016 with normal cors. CM felt to be related to ETOH and tachy-mediated. cMRI with EF 24%.  - ECHO 04/2017 EF 40-45% -  NYHA I. Volume Volume status stable on  exam. No need for lasix at this tpoint - Continue spiro 12.5 mg qhs - Continue losartan 25 mg qhs. - No BB with soft bp and low heart rate.  - He had recent BMET at his Urologist. We will obtain results.   2. Atrial fibrillation: s/p DCCV EF now 40-45% - Cut back amiodarone to 100 mg daily. Check TSH/LFTs. He understand that he should obtain yearly eye exams.  - CHA2DS2/VASc at least 2.  - Continue Eliquis 5 mg BID.   3. NSVT - Cut  amio back to 100 mg daily.    4. R Renal mass - s/p R renal nephrectomy 03/15/17.   5. Alcohol Abuse - Drinking a little. Discussed that he needs to limit alcohol use.   6. CKD  Creatinine 1.57. On 03/17/17 s/p Nephrectomy. BMET today.    Follow up in 4 months.    Darrick Grinder, NP 10/04/17

## 2017-10-04 NOTE — Patient Instructions (Signed)
DECREASE Amiodarone to 100 mg, one tab daily  Labs today We will only contact you if something comes back abnormal or we need to make some changes. Otherwise no news is good news!  Your physician recommends that you schedule a follow-up appointment in: 4 months with Dr [name]   Do the following things EVERYDAY: 1) Weigh yourself in the morning before breakfast. Write it down and keep it in a log. 2) Take your medicines as prescribed 3) Eat low salt foods-Limit salt (sodium) to 2000 mg per day.  4) Stay as active as you can everyday 5) Limit all fluids for the day to less than 2 liters

## 2017-10-05 ENCOUNTER — Telehealth (HOSPITAL_COMMUNITY): Payer: Self-pay | Admitting: *Deleted

## 2017-10-05 DIAGNOSIS — I5022 Chronic systolic (congestive) heart failure: Secondary | ICD-10-CM

## 2017-10-05 NOTE — Telephone Encounter (Signed)
Result Notes for TSH   Notes recorded by Darron Doom, RN on 10/05/2017 at 1:47 PM EDT Called and spoke with patient, he is able to come in Thursday to check labs. Appointment scheduled and labs ordered. ------  Notes recorded by Conrad Kelly, NP on 10/05/2017 at 11:33 AM EDT TSH elevated. Please call and check T3 and T4. Refer to Dr Renne Crigler for an evaluation. Continue amiodarone for now.

## 2017-10-07 ENCOUNTER — Ambulatory Visit (HOSPITAL_COMMUNITY)
Admission: RE | Admit: 2017-10-07 | Discharge: 2017-10-07 | Disposition: A | Discharge status: Home / Self Care | Payer: BLUE CROSS/BLUE SHIELD | Source: Ambulatory Visit | Attending: Internal Medicine | Admitting: Internal Medicine

## 2017-10-07 DIAGNOSIS — I5022 Chronic systolic (congestive) heart failure: Secondary | ICD-10-CM | POA: Diagnosis not present

## 2017-10-07 LAB — T4, FREE: Free T4: 0.61 ng/dL — ABNORMAL LOW (ref 0.82–1.77)

## 2017-10-08 LAB — T3, FREE: T3, Free: 2.6 pg/mL (ref 2.0–4.4)

## 2017-10-13 ENCOUNTER — Telehealth (HOSPITAL_COMMUNITY): Payer: Self-pay | Admitting: *Deleted

## 2017-10-13 NOTE — Telephone Encounter (Signed)
Obtain free t3 and free t4.   Theodore Weber 3:27 PM '

## 2017-10-13 NOTE — Telephone Encounter (Signed)
Advanced Heart Failure Triage Encounter  Patient Name: Theodore Weber  Date of Call: 10/13/17  Problem:  Patient called asking about his t3 and t4 results.  TSH was abnormal last week and patient was asked to come back for the t3 & t4.   Plan:  Will forward to Barrington Ellison, PA to review and see if referral is needing to be placed for abnormal TSH.   Darron Doom, RN

## 2017-10-13 NOTE — Telephone Encounter (Signed)
Will forward to Darrick Grinder, NP to review and see if referral needs to be placed.

## 2017-10-14 ENCOUNTER — Other Ambulatory Visit (HOSPITAL_COMMUNITY): Payer: Self-pay | Admitting: Internal Medicine

## 2017-10-14 MED ORDER — LEVOTHYROXINE SODIUM 50 MCG PO TABS: 50.0000 ug | ORAL_TABLET | Freq: Every day | ORAL | 3 refills | Status: DC

## 2017-10-14 NOTE — Telephone Encounter (Signed)
Referral faxed to Dr. Cindra Eves office at [phone].

## 2017-10-14 NOTE — Telephone Encounter (Signed)
Free t3 and t4 collected 10/07/17.  Please see below.   FreeT4- 0.61 Free T3- 2.6  Please advise.

## 2017-10-14 NOTE — Telephone Encounter (Signed)
Please call.   Discussed with Dr [name].   Will need to refer to Endocrinology-->. Dr Buddy Duty  Start synthroid 50 mcg daily.   Thanks Amy

## 2017-10-14 NOTE — Telephone Encounter (Signed)
Called and spoke with patient's wife.  Explained everything and she understands and will be waiting to hear from Dr. Cindra Eves office.  He will go ahead and start synthroid.  Referral placed and medication sent to preferred pharmacy.

## 2017-10-26 ENCOUNTER — Other Ambulatory Visit (HOSPITAL_COMMUNITY): Payer: Self-pay | Admitting: Cardiology

## 2017-10-26 MED ORDER — APIXABAN 5 MG PO TABS: 5.0000 mg | ORAL_TABLET | Freq: Two times a day (BID) | ORAL | 11 refills | Status: DC

## 2017-11-01 DIAGNOSIS — Z1389 Encounter for screening for other disorder: Secondary | ICD-10-CM | POA: Diagnosis not present

## 2017-11-01 DIAGNOSIS — I4891 Unspecified atrial fibrillation: Secondary | ICD-10-CM | POA: Diagnosis not present

## 2017-11-01 DIAGNOSIS — I10 Essential (primary) hypertension: Secondary | ICD-10-CM | POA: Diagnosis not present

## 2017-11-01 DIAGNOSIS — Z0000 Encounter for general adult medical examination without abnormal findings: Secondary | ICD-10-CM | POA: Diagnosis not present

## 2017-11-01 DIAGNOSIS — E039 Hypothyroidism, unspecified: Secondary | ICD-10-CM | POA: Diagnosis not present

## 2017-11-02 ENCOUNTER — Telehealth (HOSPITAL_COMMUNITY): Payer: Self-pay | Admitting: *Deleted

## 2017-11-02 NOTE — Telephone Encounter (Signed)
Pt left VM requesting a call back about referral to Dr.Kerr. Patient asked that we call [phone]. I called that no number no answer no voicemail set up.

## 2017-11-04 ENCOUNTER — Telehealth (HOSPITAL_COMMUNITY): Payer: Self-pay

## 2017-11-04 ENCOUNTER — Other Ambulatory Visit (HOSPITAL_COMMUNITY): Payer: Self-pay

## 2017-11-04 NOTE — Telephone Encounter (Signed)
Patient calling CHF clinic triage to follow up on Dr. Buddy Duty referral. Referral was faxed in 3 weeks ago, left VM for TJ (scheduler with Dr. Buddy Duty) to follow up and call us or patient. Patient does say he has talked with them a few times to say they received the referral however they needed more time to schedule, and they continue to "push him back" on getting him scheduled.  Advised to keep calling them to push for appt. Patient states he is doing well otherwise and feels fine.  Renee Pain, RN

## 2017-11-16 DIAGNOSIS — Z1212 Encounter for screening for malignant neoplasm of rectum: Secondary | ICD-10-CM | POA: Diagnosis not present

## 2017-12-15 DIAGNOSIS — H8110 Benign paroxysmal vertigo, unspecified ear: Secondary | ICD-10-CM | POA: Diagnosis not present

## 2018-01-04 ENCOUNTER — Other Ambulatory Visit (HOSPITAL_COMMUNITY): Payer: Self-pay | Admitting: *Deleted

## 2018-01-04 MED ORDER — AMIODARONE HCL 200 MG PO TABS: 100.0000 mg | ORAL_TABLET | Freq: Every day | ORAL | 3 refills | Status: DC

## 2018-01-19 ENCOUNTER — Other Ambulatory Visit (HOSPITAL_COMMUNITY): Payer: Self-pay | Admitting: Internal Medicine

## 2018-02-07 ENCOUNTER — Other Ambulatory Visit: Payer: Self-pay

## 2018-02-07 ENCOUNTER — Ambulatory Visit (HOSPITAL_COMMUNITY)
Admission: RE | Admit: 2018-02-07 | Discharge: 2018-02-07 | Disposition: A | Discharge status: Home / Self Care | Payer: BLUE CROSS/BLUE SHIELD | Source: Ambulatory Visit | Attending: Internal Medicine | Admitting: Internal Medicine

## 2018-02-07 ENCOUNTER — Encounter (HOSPITAL_COMMUNITY): Payer: Self-pay | Admitting: Internal Medicine

## 2018-02-07 VITALS — BP 112/78 | HR 72 | Wt 191.1 lb

## 2018-02-07 DIAGNOSIS — I4891 Unspecified atrial fibrillation: Secondary | ICD-10-CM | POA: Insufficient documentation

## 2018-02-07 DIAGNOSIS — Z79899 Other long term (current) drug therapy: Secondary | ICD-10-CM | POA: Diagnosis not present

## 2018-02-07 DIAGNOSIS — Z79890 Hormone replacement therapy (postmenopausal): Secondary | ICD-10-CM | POA: Diagnosis not present

## 2018-02-07 DIAGNOSIS — I130 Hypertensive heart and chronic kidney disease with heart failure and stage 1 through stage 4 chronic kidney disease, or unspecified chronic kidney disease: Secondary | ICD-10-CM | POA: Diagnosis not present

## 2018-02-07 DIAGNOSIS — I480 Paroxysmal atrial fibrillation: Secondary | ICD-10-CM

## 2018-02-07 DIAGNOSIS — Z905 Acquired absence of kidney: Secondary | ICD-10-CM | POA: Diagnosis not present

## 2018-02-07 DIAGNOSIS — E039 Hypothyroidism, unspecified: Secondary | ICD-10-CM | POA: Diagnosis not present

## 2018-02-07 DIAGNOSIS — Z7901 Long term (current) use of anticoagulants: Secondary | ICD-10-CM | POA: Diagnosis not present

## 2018-02-07 DIAGNOSIS — I428 Other cardiomyopathies: Secondary | ICD-10-CM | POA: Diagnosis not present

## 2018-02-07 DIAGNOSIS — I5022 Chronic systolic (congestive) heart failure: Secondary | ICD-10-CM | POA: Diagnosis not present

## 2018-02-07 DIAGNOSIS — Z87891 Personal history of nicotine dependence: Secondary | ICD-10-CM | POA: Insufficient documentation

## 2018-02-07 DIAGNOSIS — Z8249 Family history of ischemic heart disease and other diseases of the circulatory system: Secondary | ICD-10-CM | POA: Insufficient documentation

## 2018-02-07 DIAGNOSIS — N189 Chronic kidney disease, unspecified: Secondary | ICD-10-CM | POA: Insufficient documentation

## 2018-02-07 DIAGNOSIS — F1010 Alcohol abuse, uncomplicated: Secondary | ICD-10-CM | POA: Diagnosis not present

## 2018-02-07 LAB — COMPREHENSIVE METABOLIC PANEL
ALT: 17 U/L (ref 0–44)
AST: 20 U/L (ref 15–41)
Albumin: 4.4 g/dL (ref 3.5–5.0)
Alkaline Phosphatase: 50 U/L (ref 38–126)
Anion gap: 11 (ref 5–15)
BUN: 30 mg/dL — ABNORMAL HIGH (ref 6–20)
CO2: 25 mmol/L (ref 22–32)
Calcium: 9.3 mg/dL (ref 8.9–10.3)
Chloride: 99 mmol/L (ref 98–111)
Creatinine, Ser: 1.66 mg/dL — ABNORMAL HIGH (ref 0.61–1.24)
GFR calc Af Amer: 50 mL/min — ABNORMAL LOW (ref >60)
GFR calc non Af Amer: 43 mL/min — ABNORMAL LOW (ref >60)
Glucose, Bld: 95 mg/dL (ref 70–99)
Potassium: 4.2 mmol/L (ref 3.5–5.1)
Sodium: 135 mmol/L (ref 135–145)
Total Bilirubin: 0.8 mg/dL (ref 0.3–1.2)
Total Protein: 7.5 g/dL (ref 6.5–8.1)

## 2018-02-07 LAB — T4, FREE: Free T4: 0.8 ng/dL — ABNORMAL LOW (ref 0.82–1.77)

## 2018-02-07 LAB — TSH: TSH: 6.121 u[IU]/mL — ABNORMAL HIGH (ref 0.350–4.500)

## 2018-02-07 NOTE — Addendum Note (Signed)
Encounter addended by: Scarlette Calico, RN on: 02/07/2018 2:49 PM  Actions taken: Sign clinical note, Order list changed, Diagnosis association updated

## 2018-02-07 NOTE — Progress Notes (Signed)
Advanced Heart Failure Clinic Note   Referring Physician: Dr. Gwenlyn Weber Primary Care: No PCP Primary Cardiologist: Dr. Avelina Weber. Theodore Weber   HPI: Theodore Weber is a 62 y.o. male with past medical history of R renal mass, HTN, and ETOH abuse and chronic systolic CHF (EF 96-75%). S/p R nephrectomy 03/2017.   He was admitted 01/13/17-01/16/17 to [facility] as a transfer from [facility] with a several day history of flank pain. CT and US revealed R renal mass concerning for malignancy. Plan was for an elective R nephrectomy. On presentation to the OR he was in Afib RVR, so case was cancelled. He was started on cardizem and heparin gtts. Underwent nuclear stress test that showed no ischemia, severe LV HK with EF estimated 12%. Echo with EF 20-25%. He underwent TEE/DCCV, with return to NSR. He was started on Eliquis and amiodarone. He was seen by EP who recommended medical therapy pending cancer prognosis prior to considering ICD. Surgery for R nephrectomy is schedule for Dec. 2018. He was seen by the CHF team and started on Digoxin, losartan and Spiro. Noted to have very heavy ETOH use. Discharge weight 183 pounds.   Recently admitted 10/15 - 03/17/17 for Right Radical Nephrectomy 03/15/17  He presents today for regular follow up. Last visit TSH up and T4 down, started on Synthroid and referred to Endo. He has an appointment October 1st. Was unable to get anything sooner. He has noticed more energy since being on thyroid medications. Lost his long term job several weeks ago. He is still working in Geophysical data processor. Denies SOB, PND, orthopnea. No dizziness or lightheadedness. Walking 2-3 miles a day depending on the weather. Occasional ETOH use (1-2 beers on the weekends.) Taking all medications as directed. Denies palpitations or peripheral edema. No CP or exertional dyspnea. Has taken lasix once or twice a month, always directly related to high-sodium foods.   ECHO 04/06/2017 EF 40-45% Grade II  DD Bedside Echo 02/17/17 EF 30-35%  Past Medical History:  Diagnosis Date  . Atrial fibrillation ([place]) 12/2016  . Hypertension   . Renal mass 12/2016  . Systolic dysfunction with acute on chronic heart failure ([place]) 01/15/2017   Current Outpatient Medications  Medication Sig Dispense Refill  . amiodarone (PACERONE) 200 MG tablet Take 0.5 tablets (100 mg total) by mouth daily. 45 tablet 3  . apixaban (ELIQUIS) 5 MG TABS tablet Take 1 tablet (5 mg total) by mouth 2 (two) times daily. 60 tablet 11  . docusate sodium (COLACE) 100 MG capsule Take 2 capsules (200 mg total) by mouth 2 (two) times daily. 120 capsule 11  . furosemide (LASIX) 40 MG tablet Take 1 tablet (40 mg total) by mouth daily as needed (For weight gain 3 lbs or more overnight, 5 lbs or more in 1 week). 30 tablet 6  . levothyroxine (SYNTHROID, LEVOTHROID) 50 MCG tablet Take 1 tablet (50 mcg total) by mouth daily before breakfast. 30 tablet 3  . losartan (COZAAR) 25 MG tablet TAKE 1 TABLET BY MOUTH EVERY DAY 90 tablet 1  . OIL OF OREGANO PO Take 1 each by mouth See admin instructions. Take 1 dropper full by mouth daily    . OVER THE COUNTER MEDICATION Take 1 each by mouth See admin instructions. Take 1 dropperfull by mouth daily. C02 Extracted Hemp Tincture 1000 mg    . spironolactone (ALDACTONE) 25 MG tablet Take 0.5 tablets (12.5 mg total) by mouth at bedtime. 15 tablet 11   No current facility-administered medications for this  encounter.    No Known Allergies  Social History   Socioeconomic History  . Marital status: Married    Spouse name: Not on file  . Number of children: Not on file  . Years of education: Not on file  . Highest education level: Not on file  Occupational History  . Not on file  Social Needs  . Financial resource strain: Not on file  . Food insecurity:    Worry: Not on file    Inability: Not on file  . Transportation needs:    Medical: Not on file    Non-medical: Not on file  Tobacco Use  .  Smoking status: Former Smoker    Types: Cigars    Last attempt to quit: 01/09/2017    Years since quitting: 1.0  . Smokeless tobacco: Current User  Substance and Sexual Activity  . Alcohol use: Yes    Alcohol/week: 3.0 standard drinks    Types: 3 Cans of beer per week    Comment: daily- Pt reports that last drink was 01-09-17  . Drug use: No  . Sexual activity: Not on file  Lifestyle  . Physical activity:    Days per week: Not on file    Minutes per session: Not on file  . Stress: Not on file  Relationships  . Social connections:    Talks on phone: Not on file    Gets together: Not on file    Attends religious service: Not on file    Active member of club or organization: Not on file    Attends meetings of clubs or organizations: Not on file    Relationship status: Not on file  . Intimate partner violence:    Fear of current or ex partner: Not on file    Emotionally abused: Not on file    Physically abused: Not on file    Forced sexual activity: Not on file  Other Topics Concern  . Not on file  Social History Narrative  . Not on file   Family History  Problem Relation Age of Onset  . Hypertension Father    Vitals:   02/07/18 1336  BP: 112/78  Pulse: 72  SpO2: 100%  Weight: 86.7 kg (191 lb 2 oz)   Wt Readings from Last 3 Encounters:  02/07/18 86.7 kg (191 lb 2 oz)  10/04/17 88.8 kg (195 lb 12.8 oz)  07/07/17 87.7 kg (193 lb 6.4 oz)   PHYSICAL EXAM: General: Well appearing. No resp difficulty. HEENT: Normal Anicteric  Neck: Supple. JVP 5-6. Carotids 2+ bilat; no bruits. No thyromegaly or nodule noted. Cor: PMI nondisplaced. RRR, No M/G/R noted Lungs: CTAB, normal effort. No wheeze Abdomen: Soft, non-tender, non-distended, no HSM. No bruits or masses. +BS  Extremities: no cyanosis, clubbing, rash, edema Multiple tattoos Neuro: alert & oriented x 3, cranial nerves grossly intact. moves all 4 extremities w/o difficulty. Affect pleasant   ECG: NSR 72 No ST-T  abnormalities. Personally reviewed   ASSESSMENT & PLAN: 1. Chronic systolic CHF due to NICM: -  Echo 01/13/17 LVEF 20-25%, trivial MR, mild LAE, RV mildly elevated, mild RAE. R/L heart cath Aug. 2016 with normal cors. CM felt to be related to ETOH and tachy-mediated. cMRI with EF 24%.  - ECHO 04/2017 EF 40-45% - - NYHA I-II symptoms.  - Volume status stable on exam.  - Continue spiro 12.5 mg qhs - Continue losartan 25 mg qhs. - No BB with soft bp and low heart rate.   2.  Atrial fibrillation: s/p DCCV - Echo 04/2017 EF 40-45% - Continue amiodarone 100 mg daily.  - CHA2DS2/VASc at least 2.  - Continue Eliquis 5 mg BID.   3. NSVT - Continue amiodarone 100 mg daily.   4. R Renal mass - s/p R renal nephrectomy 03/15/17.  - No change to current plan.    5. Alcohol Abuse - Encouraged cessation.   6. CKD - BMET today.   7. Hypothroidism - T4 0.61 (low), TSH 21.2 (high), T3 normal - Started on synthroid 50 mg daily and referred to Endo at last visit.  - Will re-check TFTs today.  - Has endo follow up 03/01/18  Shirley Friar, PA-C 02/07/18   Patient seen and examined with the above-signed Advanced Practice Provider and/or Housestaff. I personally reviewed laboratory data, imaging studies and relevant notes. I independently examined the patient and formulated the important aspects of the plan. I have edited the note to reflect any of my changes or salient points. I have personally discussed the plan with the patient and/or family.  He is doing very well. Maintaining NSR on amio 100 daily. Volume status looks good. EF recovering. Will continue current medicines and repeat echo in 4 months. If EF completely normalized will discuss continuing low-dose amiodarone long-term versus switch possibly to flecainide. Continue Eliquis.   Glori Bickers, MD  2:35 PM

## 2018-02-07 NOTE — Patient Instructions (Signed)
Labs today  Your physician recommends that you schedule a follow-up appointment in: 3 months with echocardiogram  

## 2018-02-08 LAB — T3, FREE: T3, Free: 2.5 pg/mL (ref 2.0–4.4)

## 2018-02-09 ENCOUNTER — Other Ambulatory Visit (HOSPITAL_COMMUNITY): Payer: Self-pay | Admitting: Adult Health

## 2018-02-24 ENCOUNTER — Other Ambulatory Visit (HOSPITAL_COMMUNITY): Payer: Self-pay | Admitting: Internal Medicine

## 2018-02-25 ENCOUNTER — Other Ambulatory Visit (HOSPITAL_COMMUNITY): Payer: Self-pay | Admitting: *Deleted

## 2018-02-25 MED ORDER — SPIRONOLACTONE 25 MG PO TABS: 12.5000 mg | ORAL_TABLET | Freq: Every day | ORAL | 11 refills | Status: DC

## 2018-02-28 ENCOUNTER — Encounter (HOSPITAL_COMMUNITY): Payer: Self-pay | Admitting: *Deleted

## 2018-03-01 DIAGNOSIS — E039 Hypothyroidism, unspecified: Secondary | ICD-10-CM | POA: Diagnosis not present

## 2018-04-08 DIAGNOSIS — E039 Hypothyroidism, unspecified: Secondary | ICD-10-CM | POA: Diagnosis not present

## 2018-04-27 DIAGNOSIS — C641 Malignant neoplasm of right kidney, except renal pelvis: Secondary | ICD-10-CM | POA: Diagnosis not present

## 2018-05-02 DIAGNOSIS — C642 Malignant neoplasm of left kidney, except renal pelvis: Secondary | ICD-10-CM | POA: Diagnosis not present

## 2018-05-02 DIAGNOSIS — J984 Other disorders of lung: Secondary | ICD-10-CM | POA: Diagnosis not present

## 2018-05-02 DIAGNOSIS — C641 Malignant neoplasm of right kidney, except renal pelvis: Secondary | ICD-10-CM | POA: Diagnosis not present

## 2018-05-04 DIAGNOSIS — C641 Malignant neoplasm of right kidney, except renal pelvis: Secondary | ICD-10-CM | POA: Diagnosis not present

## 2018-05-10 ENCOUNTER — Ambulatory Visit (HOSPITAL_COMMUNITY)
Admission: RE | Admit: 2018-05-10 | Discharge: 2018-05-10 | Disposition: A | Discharge status: Home / Self Care | Payer: BLUE CROSS/BLUE SHIELD | Source: Ambulatory Visit | Attending: Internal Medicine | Admitting: Internal Medicine

## 2018-05-10 ENCOUNTER — Ambulatory Visit (HOSPITAL_BASED_OUTPATIENT_CLINIC_OR_DEPARTMENT_OTHER)
Admission: RE | Admit: 2018-05-10 | Discharge: 2018-05-10 | Disposition: A | Discharge status: Home / Self Care | Payer: BLUE CROSS/BLUE SHIELD | Source: Ambulatory Visit | Attending: Internal Medicine | Admitting: Internal Medicine

## 2018-05-10 ENCOUNTER — Other Ambulatory Visit: Payer: Self-pay

## 2018-05-10 VITALS — BP 126/88 | HR 62 | Wt 196.8 lb

## 2018-05-10 DIAGNOSIS — I472 Ventricular tachycardia: Secondary | ICD-10-CM | POA: Diagnosis not present

## 2018-05-10 DIAGNOSIS — Z79899 Other long term (current) drug therapy: Secondary | ICD-10-CM | POA: Insufficient documentation

## 2018-05-10 DIAGNOSIS — I428 Other cardiomyopathies: Secondary | ICD-10-CM | POA: Diagnosis not present

## 2018-05-10 DIAGNOSIS — Z905 Acquired absence of kidney: Secondary | ICD-10-CM | POA: Diagnosis not present

## 2018-05-10 DIAGNOSIS — Z87891 Personal history of nicotine dependence: Secondary | ICD-10-CM | POA: Diagnosis not present

## 2018-05-10 DIAGNOSIS — Z7901 Long term (current) use of anticoagulants: Secondary | ICD-10-CM | POA: Insufficient documentation

## 2018-05-10 DIAGNOSIS — Z8249 Family history of ischemic heart disease and other diseases of the circulatory system: Secondary | ICD-10-CM | POA: Insufficient documentation

## 2018-05-10 DIAGNOSIS — N189 Chronic kidney disease, unspecified: Secondary | ICD-10-CM | POA: Diagnosis not present

## 2018-05-10 DIAGNOSIS — I5022 Chronic systolic (congestive) heart failure: Secondary | ICD-10-CM

## 2018-05-10 DIAGNOSIS — I130 Hypertensive heart and chronic kidney disease with heart failure and stage 1 through stage 4 chronic kidney disease, or unspecified chronic kidney disease: Secondary | ICD-10-CM | POA: Insufficient documentation

## 2018-05-10 DIAGNOSIS — N2889 Other specified disorders of kidney and ureter: Secondary | ICD-10-CM | POA: Diagnosis not present

## 2018-05-10 DIAGNOSIS — I4891 Unspecified atrial fibrillation: Secondary | ICD-10-CM | POA: Insufficient documentation

## 2018-05-10 DIAGNOSIS — I480 Paroxysmal atrial fibrillation: Secondary | ICD-10-CM | POA: Diagnosis not present

## 2018-05-10 MED ORDER — FUROSEMIDE 40 MG PO TABS: 40.0000 mg | ORAL_TABLET | Freq: Every day | ORAL | 6 refills | Status: DC | PRN

## 2018-05-10 NOTE — Progress Notes (Signed)
  Echocardiogram 2D Echocardiogram has been performed.  Theodore Weber L Androw 05/10/2018, 1:42 PM

## 2018-05-10 NOTE — Patient Instructions (Signed)
Your physician recommends that you schedule a follow-up appointment in: 6 months with Dr. [name].  A refill for Lasix 40mg  was sent to pharmacy.

## 2018-05-10 NOTE — Progress Notes (Signed)
Advanced Heart Failure Clinic Note   Referring Physician: Dr. Gwenlyn Found Primary Care: No PCP Primary Cardiologist: Dr. Avelina Laine. Theodore Weber   HPI: [name] is a 62 y.o. male with past medical history of R renal mass, HTN, and ETOH abuse and chronic systolic CHF (EF 67-34%). S/p R nephrectomy 03/2017.   He was admitted 01/13/17-01/16/17 to [facility] as a transfer from [facility] with a several day history of flank pain. CT and US revealed R renal mass concerning for malignancy. Plan was for an elective R nephrectomy. On presentation to the OR he was in Afib RVR, so case was cancelled. He was started on cardizem and heparin gtts. Underwent nuclear stress test that showed no ischemia, severe LV HK with EF estimated 12%. Echo with EF 20-25%. He underwent TEE/DCCV, with return to NSR. He was started on Eliquis and amiodarone. He was seen by EP who recommended medical therapy pending cancer prognosis prior to considering ICD. Surgery for R nephrectomy is schedule for Dec. 2018. He was seen by the CHF team and started on Digoxin, losartan and Spiro. Noted to have very heavy ETOH use. Discharge weight 183 pounds.   Recently admitted 10/15 - 03/17/17 for Right Radical Nephrectomy 03/15/17  He presents today for HF follow up. Echo today EF 50-55%. Overall doing well. Walking 3.5 miles daily. No SOB with long distance or hills. No orthopnea, PND, or edema. No CP or dizziness. Working full-time as a Building control surveyor. Weight up 5 lbs on our scale. Taking all medications. Takes lasix PRN for 3 lb weight gain, about once/month. Limits fluid and salt intake. Drinks 2 beers on weekends.   ECHO 04/06/2017 EF 40-45% Grade II DD Bedside Echo 02/17/17 EF 30-35% Echo 05/10/18: EF 50-55%  Past Medical History:  Diagnosis Date  . Atrial fibrillation ([place]) 12/2016  . Hypertension   . Renal mass 12/2016  . Systolic dysfunction with acute on chronic heart failure ([place]) 01/15/2017   Current Outpatient Medications    Medication Sig Dispense Refill  . amiodarone (PACERONE) 200 MG tablet Take 0.5 tablets (100 mg total) by mouth daily. 45 tablet 3  . apixaban (ELIQUIS) 5 MG TABS tablet Take 1 tablet (5 mg total) by mouth 2 (two) times daily. 60 tablet 11  . furosemide (LASIX) 40 MG tablet Take 1 tablet (40 mg total) by mouth daily as needed (For weight gain 3 lbs or more overnight, 5 lbs or more in 1 week). 30 tablet 6  . levothyroxine (SYNTHROID, LEVOTHROID) 75 MCG tablet Take 75 mcg by mouth daily before breakfast.    . losartan (COZAAR) 25 MG tablet TAKE 1 TABLET BY MOUTH EVERY DAY 90 tablet 1  . OIL OF OREGANO PO Take 1 each by mouth See admin instructions. Take 1 dropper full by mouth daily    . spironolactone (ALDACTONE) 25 MG tablet Take 0.5 tablets (12.5 mg total) by mouth at bedtime. 15 tablet 11   No current facility-administered medications for this encounter.    No Known Allergies  Social History   Socioeconomic History  . Marital status: Married    Spouse name: Not on file  . Number of children: Not on file  . Years of education: Not on file  . Highest education level: Not on file  Occupational History  . Not on file  Social Needs  . Financial resource strain: Not on file  . Food insecurity:    Worry: Not on file    Inability: Not on file  . Transportation needs:  Medical: Not on file    Non-medical: Not on file  Tobacco Use  . Smoking status: Former Smoker    Types: Cigars    Last attempt to quit: 01/09/2017    Years since quitting: 1.3  . Smokeless tobacco: Current User  Substance and Sexual Activity  . Alcohol use: Yes    Alcohol/week: 3.0 standard drinks    Types: 3 Cans of beer per week    Comment: daily- Pt reports that last drink was 01-09-17  . Drug use: No  . Sexual activity: Not on file  Lifestyle  . Physical activity:    Days per week: Not on file    Minutes per session: Not on file  . Stress: Not on file  Relationships  . Social connections:    Talks on  phone: Not on file    Gets together: Not on file    Attends religious service: Not on file    Active member of club or organization: Not on file    Attends meetings of clubs or organizations: Not on file    Relationship status: Not on file  . Intimate partner violence:    Fear of current or ex partner: Not on file    Emotionally abused: Not on file    Physically abused: Not on file    Forced sexual activity: Not on file  Other Topics Concern  . Not on file  Social History Narrative  . Not on file   Family History  Problem Relation Age of Onset  . Hypertension Father    Vitals:   05/10/18 1340  BP: 126/88  Pulse: 62  SpO2: 95%  Weight: 89.3 kg (196 lb 12.8 oz)   Wt Readings from Last 3 Encounters:  05/10/18 89.3 kg (196 lb 12.8 oz)  02/07/18 86.7 kg (191 lb 2 oz)  10/04/17 88.8 kg (195 lb 12.8 oz)   PHYSICAL EXAM: General: Well appearing. No resp difficulty. HEENT: Normal Neck: Supple. JVP 5-6. Carotids 2+ bilat; no bruits. No thyromegaly or nodule noted. Cor: PMI nondisplaced. RRR, No M/G/R noted Lungs: CTAB, normal effort. Abdomen: Soft, non-tender, non-distended, no HSM. No bruits or masses. +BS  Extremities: No cyanosis, clubbing, or rash. R and LLE no edema. Multiple tattoos Neuro: Alert & orientedx3, cranial nerves grossly intact. moves all 4 extremities w/o difficulty. Affect pleasant   ASSESSMENT & PLAN: 1. Chronic systolic CHF due to NICM: -  Echo 01/13/17 LVEF 20-25%, trivial MR, mild LAE, RV mildly elevated, mild RAE. R/L heart cath Aug. 2016 with normal cors. CM felt to be related to ETOH and tachy-mediated. cMRI with EF 24%.  - ECHO 04/2017 EF 40-45%  - Echo today EF 50-55% - NYHA I symptoms.  - Volume status stable - Continue spiro 12.5 mg qhs - Continue losartan 25 mg qhs. - HR too low for BB.   2. Atrial fibrillation: s/p DCCV - Echo 04/2017 EF 40-45% - Continue amiodarone 100 mg daily. LFTs stable 01/2018. See discussion below about thyroid. -  CHA2DS2/VASc at least 2.  - Continue Eliquis 5 mg BID. Denies bleeding.   3. NSVT - Continue amiodarone 100 mg daily.   4. R Renal mass - s/p R renal nephrectomy 03/15/17.  - No change.   5. Alcohol Abuse - Encouraged cessation.   6. CKD - Renal function stable 1.66 01/2018  7. Hypothroidism - TSH better on last check 21 > 6.1 01/2018 - Continue synthroid 75 mcg daily - Followed by endo. Followed by Dr Buddy Duty.  Labwork reportedly normal in November.   Doing great. Follow up in 6 months. Provided refill for PRN lasix.   Georgiana Shore, NP 05/10/18   Patient seen and examined with the above-signed Advanced Practice Provider and/or Housestaff. I personally reviewed laboratory data, imaging studies and relevant notes. I independently examined the patient and formulated the important aspects of the plan. I have edited the note to reflect any of my changes or salient points. I have personally discussed the plan with the patient and/or family.  He is doing very well. NYHA I. Volume status looks good. Maintaining NSR on low dose amio 100 daily. Echo today reviewed personally and EF up to 50%. No bleeding with Eliquis. With recovery of EF will consider switching amio to flecainide at next visit to avoid potential long-term side effects of amiodarone.   Glori Bickers, MD  4:27 PM

## 2018-06-20 IMAGING — MR MR CARD MORPHOLOGY WO/W CM
9 of 10 series · 15 of 16 positions shown · IV contrast (25    Multihance)
Comparison: none

CLINICAL DATA: Cardiomyopathy

EXAM:
CARDIAC MRI
TECHNIQUE: The patient was scanned on a 1.5 Tesla GE magnet. A dedicated
cardiac coil was used. Functional imaging was done using Fiesta
sequences. [DATE], and 4 chamber views were done to assess for RWMA's.
Modified Sayadov rule using a short axis stack was used to
calculate an ejection fraction on a dedicated work station using
Circle software. The patient received 27 cc of Multihance. After 10
minutes inversion recovery sequences were used to assess for
infiltration and scar tissue.
CONTRAST:  27 cc Multihance

[Series 3: bSSFP · sagittal · 8.0mm · 1.37mm/px · 1 of 17 slices shown (1 of 4)]
[im 1/17]
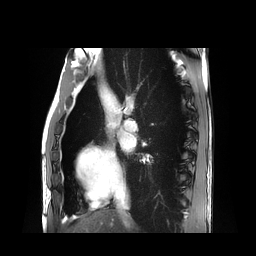

[Series 4: bSSFP · oblique · 8.0mm · 1.45mm/px · 1 of 20 slices shown (2 of 4)]
[im 1/20]
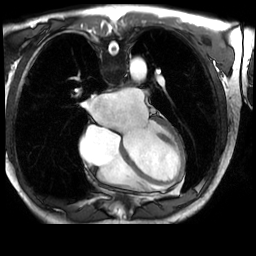

[Series 5: bSSFP · oblique · 8.0mm · 1.48mm/px · 7 of 380 slices shown (3 of 4)]
[im 1/380]
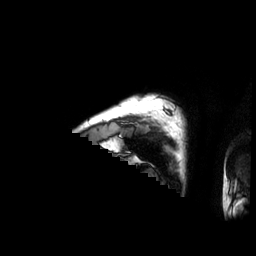
[im 64/380]
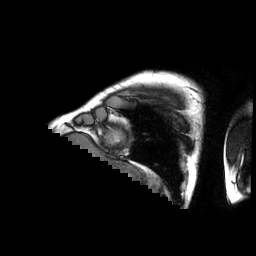
[im 127/380]
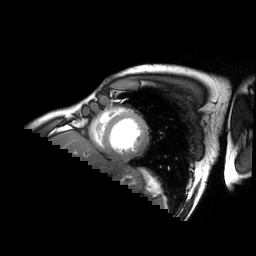
[im 190/380]
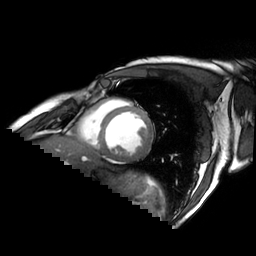
[im 253/380]
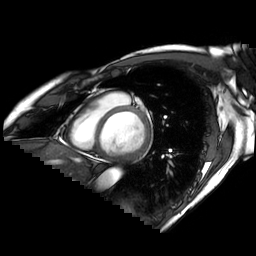
[im 316/380]
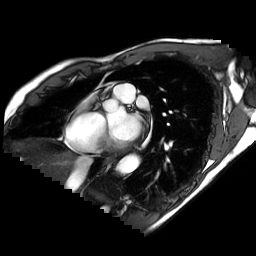
[im 380/380]
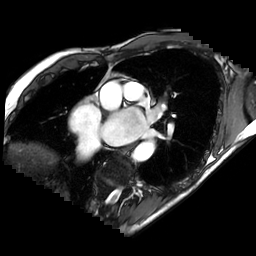

[Series 6: T2 · oblique · 8.0mm · 1.48mm/px · 1 of 60 slices shown]
[im 1/60]
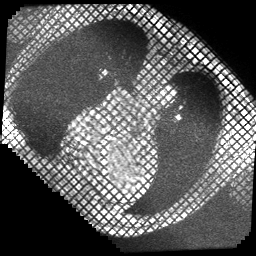

[Series 7: bSSFP · oblique · 8.0mm · 1.48mm/px · 1 of 60 slices shown (4 of 4)]
[im 1/60]
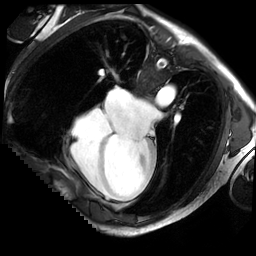

[Series 8: cine ir · oblique · 8.0mm · 1.37mm/px · 1 of 30 slices shown]
[im 1/30]
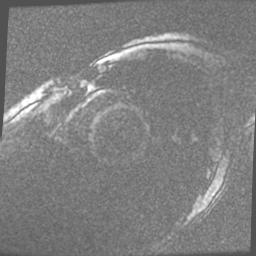

[Series 11: delayed ir prep · oblique · 8.0mm · 1.48mm/px · 1 of 12 slices shown (1 of 2)]
[im 1/12]
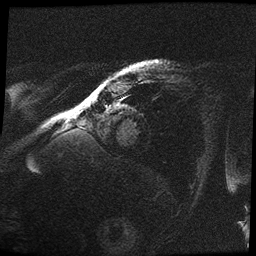

[Series 12: delayed ir prep · oblique · 8.0mm · 1.48mm/px · 1 of 8 slices shown (2 of 2)]
[im 1/8]
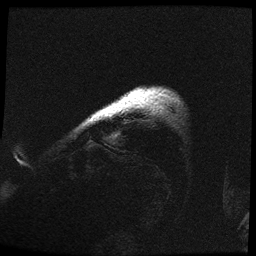

[Series 13: rad delayed ir · oblique · 8.0mm · 1.48mm/px · 1 of 3 slices shown]
[im 1/3]
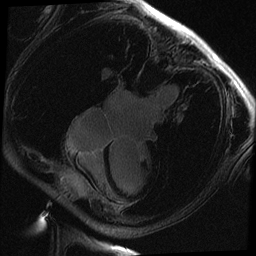

[15 of 16 positions shown; findings below may reference images not displayed]

FINDINGS: There was moderate LAE and mild KINKELA. The RV was normal in size and
function The aortic root was 3.6 cm in diameter. There was no
ASD/VSD There was a trivial pericardial effusion

The aortic valve was tri leaflet and normal. There was mild MR.
There was mild TR The LV was severely dilated with diffuse
hypokinesis. There was no mural apical thrombus. The quantitative EF

Was 24% (EDV 234 cc ESV 179 cc SV 55 cc) Delayed enhancement images
with gadolinium showed no infiltration or scar
IMPRESSION: 1) Severe LVE with diffuse hypokinesis EF 24%

2) Normla RV size and function

3) Aortic root 3.6 cm

4) Moderate LAE and mild KINKELA

5) No gadolinium uptake on delayed inversion recovery sequences No
scar, infarct or infiltration

6) Mild MR

7) Mild TR

8) Trivial pericardial effusion

Bebiano Torky

## 2018-07-20 ENCOUNTER — Other Ambulatory Visit (HOSPITAL_COMMUNITY): Payer: Self-pay | Admitting: Internal Medicine

## 2018-10-15 ENCOUNTER — Other Ambulatory Visit (HOSPITAL_COMMUNITY): Payer: Self-pay | Admitting: Internal Medicine

## 2018-11-03 ENCOUNTER — Encounter (HOSPITAL_COMMUNITY): Payer: BLUE CROSS/BLUE SHIELD | Admitting: Internal Medicine

## 2018-11-09 ENCOUNTER — Telehealth (HOSPITAL_COMMUNITY): Payer: BLUE CROSS/BLUE SHIELD | Admitting: Internal Medicine

## 2018-11-18 ENCOUNTER — Ambulatory Visit (HOSPITAL_COMMUNITY)
Admission: RE | Admit: 2018-11-18 | Discharge: 2018-11-18 | Disposition: A | Discharge status: Home / Self Care | Payer: Managed Care, Other (non HMO) | Source: Ambulatory Visit | Attending: Urology | Admitting: Urology

## 2018-11-18 ENCOUNTER — Other Ambulatory Visit (HOSPITAL_COMMUNITY): Payer: Self-pay | Admitting: Urology

## 2018-11-18 ENCOUNTER — Other Ambulatory Visit: Payer: Self-pay

## 2018-11-18 DIAGNOSIS — C641 Malignant neoplasm of right kidney, except renal pelvis: Secondary | ICD-10-CM | POA: Insufficient documentation

## 2018-12-19 ENCOUNTER — Other Ambulatory Visit (HOSPITAL_COMMUNITY): Payer: Self-pay

## 2018-12-19 MED ORDER — FUROSEMIDE 40 MG PO TABS: 40.0000 mg | ORAL_TABLET | Freq: Every day | ORAL | 6 refills | Status: DC | PRN

## 2018-12-31 ENCOUNTER — Other Ambulatory Visit (HOSPITAL_COMMUNITY): Payer: Self-pay | Admitting: Adult Health

## 2019-01-02 ENCOUNTER — Ambulatory Visit (HOSPITAL_COMMUNITY)
Admission: RE | Admit: 2019-01-02 | Discharge: 2019-01-02 | Disposition: A | Discharge status: Home / Self Care | Payer: Managed Care, Other (non HMO) | Source: Ambulatory Visit | Attending: Internal Medicine | Admitting: Internal Medicine

## 2019-01-02 ENCOUNTER — Encounter (HOSPITAL_COMMUNITY): Payer: Self-pay | Admitting: Internal Medicine

## 2019-01-02 ENCOUNTER — Other Ambulatory Visit: Payer: Self-pay

## 2019-01-02 VITALS — BP 118/77 | HR 72 | Wt 191.8 lb

## 2019-01-02 DIAGNOSIS — I472 Ventricular tachycardia: Secondary | ICD-10-CM | POA: Diagnosis not present

## 2019-01-02 DIAGNOSIS — E039 Hypothyroidism, unspecified: Secondary | ICD-10-CM | POA: Diagnosis not present

## 2019-01-02 DIAGNOSIS — Z905 Acquired absence of kidney: Secondary | ICD-10-CM | POA: Insufficient documentation

## 2019-01-02 DIAGNOSIS — I5022 Chronic systolic (congestive) heart failure: Secondary | ICD-10-CM | POA: Diagnosis present

## 2019-01-02 DIAGNOSIS — I130 Hypertensive heart and chronic kidney disease with heart failure and stage 1 through stage 4 chronic kidney disease, or unspecified chronic kidney disease: Secondary | ICD-10-CM | POA: Diagnosis not present

## 2019-01-02 DIAGNOSIS — Z79899 Other long term (current) drug therapy: Secondary | ICD-10-CM | POA: Diagnosis not present

## 2019-01-02 DIAGNOSIS — I4891 Unspecified atrial fibrillation: Secondary | ICD-10-CM

## 2019-01-02 DIAGNOSIS — Z87891 Personal history of nicotine dependence: Secondary | ICD-10-CM | POA: Diagnosis not present

## 2019-01-02 DIAGNOSIS — Z8249 Family history of ischemic heart disease and other diseases of the circulatory system: Secondary | ICD-10-CM | POA: Insufficient documentation

## 2019-01-02 DIAGNOSIS — F1010 Alcohol abuse, uncomplicated: Secondary | ICD-10-CM | POA: Insufficient documentation

## 2019-01-02 DIAGNOSIS — I428 Other cardiomyopathies: Secondary | ICD-10-CM | POA: Insufficient documentation

## 2019-01-02 DIAGNOSIS — N189 Chronic kidney disease, unspecified: Secondary | ICD-10-CM | POA: Insufficient documentation

## 2019-01-02 DIAGNOSIS — Z7901 Long term (current) use of anticoagulants: Secondary | ICD-10-CM | POA: Diagnosis not present

## 2019-01-02 DIAGNOSIS — Z79890 Hormone replacement therapy (postmenopausal): Secondary | ICD-10-CM | POA: Diagnosis not present

## 2019-01-02 LAB — COMPREHENSIVE METABOLIC PANEL
ALT: 15 U/L (ref 0–44)
AST: 19 U/L (ref 15–41)
Albumin: 4.1 g/dL (ref 3.5–5.0)
Alkaline Phosphatase: 41 U/L (ref 38–126)
Anion gap: 8 (ref 5–15)
BUN: 35 mg/dL — ABNORMAL HIGH (ref 8–23)
CO2: 24 mmol/L (ref 22–32)
Calcium: 9 mg/dL (ref 8.9–10.3)
Chloride: 104 mmol/L (ref 98–111)
Creatinine, Ser: 1.8 mg/dL — ABNORMAL HIGH (ref 0.61–1.24)
GFR calc Af Amer: 46 mL/min — ABNORMAL LOW (ref >60)
GFR calc non Af Amer: 40 mL/min — ABNORMAL LOW (ref >60)
Glucose, Bld: 90 mg/dL (ref 70–99)
Potassium: 4.3 mmol/L (ref 3.5–5.1)
Sodium: 136 mmol/L (ref 135–145)
Total Bilirubin: 0.5 mg/dL (ref 0.3–1.2)
Total Protein: 6.9 g/dL (ref 6.5–8.1)

## 2019-01-02 LAB — CBC
HCT: 36.6 % — ABNORMAL LOW (ref 39.0–52.0)
Hemoglobin: 12 g/dL — ABNORMAL LOW (ref 13.0–17.0)
MCH: 31.7 pg (ref 26.0–34.0)
MCHC: 32.8 g/dL (ref 30.0–36.0)
MCV: 96.6 fL (ref 80.0–100.0)
Platelets: 254 10*3/uL (ref 150–400)
RBC: 3.79 MIL/uL — ABNORMAL LOW (ref 4.22–5.81)
RDW: 12.4 % (ref 11.5–15.5)
WBC: 5.9 10*3/uL (ref 4.0–10.5)
nRBC: 0 % (ref 0.0–0.2)

## 2019-01-02 NOTE — Addendum Note (Signed)
Encounter addended by: Jovita Kussmaul, RN on: 01/02/2019 3:36 PM  Actions taken: Order list changed, Diagnosis association updated, Charge Capture section accepted, Clinical Note Signed

## 2019-01-02 NOTE — Progress Notes (Signed)
Advanced Heart Failure Clinic Note   Referring Physician: Dr. Gwenlyn Found Primary Care: No PCP Primary Cardiologist: Dr. Avelina Laine. Theodore Weber   HPI: Theodore Weber is a 63 y.o. male with past medical history of R renal mass, HTN, and ETOH abuse and chronic systolic CHF (EF 02-72%). S/p R nephrectomy 03/2017.   He was admitted 01/13/17-01/16/17 to [facility] as a transfer from [facility] with a several day history of flank pain. CT and US revealed R renal mass concerning for malignancy. Plan was for an elective R nephrectomy. On presentation to the OR he was in Afib RVR, so case was cancelled. He was started on cardizem and heparin gtts. Underwent nuclear stress test that showed no ischemia, severe LV HK with EF estimated 12%. Echo with EF 20-25%. He underwent TEE/DCCV, with return to NSR. He was started on Eliquis and amiodarone. He was seen by EP who recommended medical therapy pending cancer prognosis prior to considering ICD. Surgery for R nephrectomy is schedule for Dec. 2018. He was seen by the CHF team and started on Digoxin, losartan and Spiro. Noted to have very heavy ETOH use. Discharge weight 183 pounds.   Recently admitted 10/15 - 03/17/17 for Right Radical Nephrectomy 03/15/17  He presents today for HF follow up. Echo 12/19 EF 50-55%. Overall doing great. Got another job with [employer]. Working 40 hours/week. Feels great. No SOB, edema, orthopnea or PND. Still walking regularly. In July walked less due to heat. Walking 2-3 miles per/day. No palpitations. No bleeding on Eliquis. Takes his BP every day religiously. SBP usually 105-110 but can be low teens or 90s.  Has a very occasionall reading of "irregular HR" but very rare. HR was in 70s at that time. Drinks 2 beers on the weekend  ECHO 04/06/2017 EF 40-45% Grade II DD Bedside Echo 02/17/17 EF 30-35% Echo 05/10/18: EF 50-55%  Past Medical History:  Diagnosis Date  . Atrial fibrillation ([place]) 12/2016  . Hypertension   . Renal mass  12/2016  . Systolic dysfunction with acute on chronic heart failure ([place]) 01/15/2017   Current Outpatient Medications  Medication Sig Dispense Refill  . amiodarone (PACERONE) 200 MG tablet TAKE 1/2 TABLET(100MG  TOTAL) BY MOUTH DAILY 45 tablet 3  . ELIQUIS 5 MG TABS tablet TAKE 1 TABLET(5 MG) BY MOUTH TWICE DAILY 60 tablet 3  . furosemide (LASIX) 40 MG tablet Take 1 tablet (40 mg total) by mouth daily as needed (For weight gain 3 lbs or more overnight, 5 lbs or more in 1 week). 30 tablet 6  . levothyroxine (SYNTHROID, LEVOTHROID) 75 MCG tablet Take 75 mcg by mouth daily before breakfast.    . losartan (COZAAR) 25 MG tablet TAKE 1 TABLET BY MOUTH EVERY DAY 90 tablet 1  . OIL OF OREGANO PO Take 1 each by mouth See admin instructions. Take 1 dropper full by mouth daily    . spironolactone (ALDACTONE) 25 MG tablet Take 0.5 tablets (12.5 mg total) by mouth at bedtime. 15 tablet 11   No current facility-administered medications for this encounter.    No Known Allergies  Social History   Socioeconomic History  . Marital status: Married    Spouse name: Not on file  . Number of children: Not on file  . Years of education: Not on file  . Highest education level: Not on file  Occupational History  . Not on file  Social Needs  . Financial resource strain: Not on file  . Food insecurity    Worry: Not  on file    Inability: Not on file  . Transportation needs    Medical: Not on file    Non-medical: Not on file  Tobacco Use  . Smoking status: Former Smoker    Types: Cigars    Quit date: 01/09/2017    Years since quitting: 1.9  . Smokeless tobacco: Current User  Substance and Sexual Activity  . Alcohol use: Yes    Alcohol/week: 3.0 standard drinks    Types: 3 Cans of beer per week    Comment: daily- Pt reports that last drink was 01-09-17  . Drug use: No  . Sexual activity: Not on file  Lifestyle  . Physical activity    Days per week: Not on file    Minutes per session: Not on file  .  Stress: Not on file  Relationships  . Social Herbalist on phone: Not on file    Gets together: Not on file    Attends religious service: Not on file    Active member of club or organization: Not on file    Attends meetings of clubs or organizations: Not on file    Relationship status: Not on file  . Intimate partner violence    Fear of current or ex partner: Not on file    Emotionally abused: Not on file    Physically abused: Not on file    Forced sexual activity: Not on file  Other Topics Concern  . Not on file  Social History Narrative  . Not on file   Family History  Problem Relation Age of Onset  . Hypertension Father    Vitals:   01/02/19 1443  BP: 118/77  Pulse: 72  SpO2: 97%  Weight: 87 kg (191 lb 12.8 oz)   Wt Readings from Last 3 Encounters:  01/02/19 87 kg (191 lb 12.8 oz)  05/10/18 89.3 kg (196 lb 12.8 oz)  02/07/18 86.7 kg (191 lb 2 oz)   PHYSICAL EXAM: General:  Well appearing. No resp difficulty HEENT: normal Neck: supple. no JVD. Carotids 2+ bilat; no bruits. No lymphadenopathy or thryomegaly appreciated. Cor: PMI nondisplaced. Regular rate & rhythm. No rubs, gallops or murmurs. Lungs: clear but decreased BS Abdomen: soft, nontender, nondistended. No hepatosplenomegaly. No bruits or masses. Good bowel sounds. Extremities: no cyanosis, clubbing, rash, edema Neuro: alert & orientedx3, cranial nerves grossly intact. moves all 4 extremities w/o difficulty. Affect pleasant    ASSESSMENT & PLAN: 1. Chronic systolic CHF due to NICM: -  Echo 01/13/17 LVEF 20-25%, trivial MR, mild LAE, RV mildly elevated, mild RAE. R/L heart cath Aug. 2016 with normal cors. CM felt to be related to ETOH and tachy-mediated. cMRI with EF 24%.  - ECHO 04/2017 EF 40-45%  - Echo 12/19 EF 50-55% - NYHA I symptoms.  - Volume status stable - Continue spiro 12.5 mg qhs - Continue losartan 25 mg qhs. - Repeat echo ensure stability. BP too low to titrate meds  2. Atrial  fibrillation: s/p DCCV - Continue amiodarone 100 mg daily. LFTs stable 01/2018. See discussion below about thyroid. - CHA2DS2/VASc at least 2.  - Continue Eliquis 5 mg BID. Denies bleeding.  - Has occasional irregular readings on BP cuff. We discussed AliveCor montioring  3. NSVT - Continue amiodarone 100 mg daily.   4. R Renal mass - s/p R renal nephrectomy 03/15/17.  - No change.   5. Alcohol Abuse - Encouraged cessation.   6. CKD - Renal function stable 1.66 01/2018 -  Repeat labs today  7. Hypothroidism - TSH better on last check 21 > 6.1 01/2018 - Continue synthroid 75 mcg daily - Followed by endo. Followed by Dr Buddy Duty. Labwork reportedly normal in November.  - Will repeat TFTs today  Glori Bickers, MD 01/02/19

## 2019-01-02 NOTE — Patient Instructions (Signed)
Labs were done today. We will call you with any ABNORMAL results. No news is good news!  EKG was completed.  Your physician has requested that you have an echocardiogram. Echocardiography is a painless test that uses sound waves to create images of your heart. It provides your doctor with information about the size and shape of your heart and how well your heart's chambers and valves are working. This procedure takes approximately one hour. There are no restrictions for this procedure.  Your physician wants you to follow-up in: 1 year. You will receive a reminder letter in the mail two months in advance. If you don't receive a letter, please call our office to schedule the follow-up appointment.  At the [facility], you and your health needs are our priority. As part of our continuing mission to provide you with exceptional heart care, we have created designated Provider Care Teams. These Care Teams include your primary Cardiologist (physician) and Advanced Practice Providers (APPs- Physician Assistants and Nurse Practitioners) who all work together to provide you with the care you need, when you need it.   You may see any of the following providers on your designated Care Team at your next follow up: Marland Kitchen Dr Glori Bickers . Dr Loralie Champagne . Darrick Grinder, NP   Please be sure to bring in all your medications bottles to every appointment.

## 2019-01-14 ENCOUNTER — Other Ambulatory Visit (HOSPITAL_COMMUNITY): Payer: Self-pay | Admitting: Internal Medicine

## 2019-02-13 ENCOUNTER — Other Ambulatory Visit: Payer: Self-pay | Admitting: *Deleted

## 2019-02-14 MED ORDER — SPIRONOLACTONE 25 MG PO TABS: 12.5000 mg | ORAL_TABLET | Freq: Every day | ORAL | 11 refills | Status: DC

## 2019-02-21 ENCOUNTER — Other Ambulatory Visit (HOSPITAL_COMMUNITY): Payer: Self-pay

## 2019-02-21 MED ORDER — APIXABAN 5 MG PO TABS: ORAL_TABLET | ORAL | 3 refills | Status: DC

## 2019-02-22 ENCOUNTER — Telehealth (HOSPITAL_COMMUNITY): Payer: Self-pay | Admitting: Cardiology

## 2019-02-22 NOTE — Telephone Encounter (Signed)
Patient called and left message with questions regarding his Alive Core EKG readings, would like to know where to send information once he records.   LMOM

## 2019-02-22 NOTE — Telephone Encounter (Signed)
Patient reports he is very frustrated with the process and will just return equipment

## 2019-02-23 NOTE — Telephone Encounter (Signed)
Patient returned call and reports he is unable to have cell phone on him while at work. Reports he is wearing alive cor monitor and is getting very frustrated with the process. Unable to get ekgs to go through. Reports he will just return equipment and be done with it.

## 2019-04-03 ENCOUNTER — Emergency Department (HOSPITAL_COMMUNITY): Payer: Worker's Compensation | Admitting: Certified Registered Nurse Anesthetist

## 2019-04-03 ENCOUNTER — Emergency Department (HOSPITAL_COMMUNITY): Payer: Managed Care, Other (non HMO)

## 2019-04-03 ENCOUNTER — Encounter (HOSPITAL_COMMUNITY): Payer: Self-pay | Admitting: Certified Registered"

## 2019-04-03 ENCOUNTER — Encounter (HOSPITAL_COMMUNITY)
Admission: EM | Disposition: A | Discharge status: Home / Self Care | Payer: Self-pay | Source: Home / Self Care | Attending: Emergency Medicine

## 2019-04-03 ENCOUNTER — Inpatient Hospital Stay: Admit: 2019-04-03 | Payer: Managed Care, Other (non HMO) | Admitting: Orthopedic Surgery

## 2019-04-03 ENCOUNTER — Other Ambulatory Visit: Payer: Self-pay

## 2019-04-03 ENCOUNTER — Emergency Department (HOSPITAL_BASED_OUTPATIENT_CLINIC_OR_DEPARTMENT_OTHER): Payer: Managed Care, Other (non HMO) | Attending: Emergency Medicine

## 2019-04-03 ENCOUNTER — Encounter (HOSPITAL_BASED_OUTPATIENT_CLINIC_OR_DEPARTMENT_OTHER): Payer: Self-pay | Admitting: Emergency Medicine

## 2019-04-03 ENCOUNTER — Ambulatory Visit (HOSPITAL_BASED_OUTPATIENT_CLINIC_OR_DEPARTMENT_OTHER)
Admission: EM | Admit: 2019-04-03 | Discharge: 2019-04-03 | Disposition: A | Discharge status: Home / Self Care | Payer: Worker's Compensation | Attending: Emergency Medicine | Admitting: Emergency Medicine

## 2019-04-03 DIAGNOSIS — I5022 Chronic systolic (congestive) heart failure: Secondary | ICD-10-CM | POA: Insufficient documentation

## 2019-04-03 DIAGNOSIS — E46 Unspecified protein-calorie malnutrition: Secondary | ICD-10-CM | POA: Diagnosis not present

## 2019-04-03 DIAGNOSIS — F1010 Alcohol abuse, uncomplicated: Secondary | ICD-10-CM | POA: Insufficient documentation

## 2019-04-03 DIAGNOSIS — E039 Hypothyroidism, unspecified: Secondary | ICD-10-CM | POA: Diagnosis not present

## 2019-04-03 DIAGNOSIS — D649 Anemia, unspecified: Secondary | ICD-10-CM | POA: Insufficient documentation

## 2019-04-03 DIAGNOSIS — I480 Paroxysmal atrial fibrillation: Secondary | ICD-10-CM | POA: Diagnosis not present

## 2019-04-03 DIAGNOSIS — Z79899 Other long term (current) drug therapy: Secondary | ICD-10-CM | POA: Insufficient documentation

## 2019-04-03 DIAGNOSIS — H9190 Unspecified hearing loss, unspecified ear: Secondary | ICD-10-CM | POA: Diagnosis not present

## 2019-04-03 DIAGNOSIS — Z905 Acquired absence of kidney: Secondary | ICD-10-CM | POA: Insufficient documentation

## 2019-04-03 DIAGNOSIS — Y990 Civilian activity done for income or pay: Secondary | ICD-10-CM | POA: Insufficient documentation

## 2019-04-03 DIAGNOSIS — Z87891 Personal history of nicotine dependence: Secondary | ICD-10-CM | POA: Diagnosis not present

## 2019-04-03 DIAGNOSIS — S6722XA Crushing injury of left hand, initial encounter: Secondary | ICD-10-CM | POA: Insufficient documentation

## 2019-04-03 DIAGNOSIS — S62613B Displaced fracture of proximal phalanx of left middle finger, initial encounter for open fracture: Secondary | ICD-10-CM | POA: Diagnosis not present

## 2019-04-03 DIAGNOSIS — Z419 Encounter for procedure for purposes other than remedying health state, unspecified: Secondary | ICD-10-CM

## 2019-04-03 DIAGNOSIS — I428 Other cardiomyopathies: Secondary | ICD-10-CM | POA: Insufficient documentation

## 2019-04-03 DIAGNOSIS — Z7901 Long term (current) use of anticoagulants: Secondary | ICD-10-CM | POA: Insufficient documentation

## 2019-04-03 DIAGNOSIS — I110 Hypertensive heart disease with heart failure: Secondary | ICD-10-CM | POA: Insufficient documentation

## 2019-04-03 DIAGNOSIS — W230XXA Caught, crushed, jammed, or pinched between moving objects, initial encounter: Secondary | ICD-10-CM | POA: Insufficient documentation

## 2019-04-03 DIAGNOSIS — Z20828 Contact with and (suspected) exposure to other viral communicable diseases: Secondary | ICD-10-CM | POA: Diagnosis not present

## 2019-04-03 DIAGNOSIS — Z23 Encounter for immunization: Secondary | ICD-10-CM | POA: Insufficient documentation

## 2019-04-03 DIAGNOSIS — S6292XB Unspecified fracture of left wrist and hand, initial encounter for open fracture: Secondary | ICD-10-CM

## 2019-04-03 DIAGNOSIS — S62611B Displaced fracture of proximal phalanx of left index finger, initial encounter for open fracture: Secondary | ICD-10-CM | POA: Insufficient documentation

## 2019-04-03 DIAGNOSIS — Z8249 Family history of ischemic heart disease and other diseases of the circulatory system: Secondary | ICD-10-CM | POA: Insufficient documentation

## 2019-04-03 HISTORY — DX: Cardiac arrhythmia, unspecified: I49.9

## 2019-04-03 HISTORY — DX: Malignant (primary) neoplasm, unspecified: C80.1

## 2019-04-03 HISTORY — DX: Unspecified hearing loss, unspecified ear: H91.90

## 2019-04-03 HISTORY — PX: OPEN REDUCTION INTERNAL FIXATION (ORIF) HAND: SHX5991

## 2019-04-03 HISTORY — PX: TENDON REPAIR: SHX5111

## 2019-04-03 LAB — CBC WITH DIFFERENTIAL/PLATELET
Abs Immature Granulocytes: 0.05 10*3/uL (ref 0.00–0.07)
Basophils Absolute: 0.1 10*3/uL (ref 0.0–0.1)
Basophils Relative: 1 %
Eosinophils Absolute: 0 10*3/uL (ref 0.0–0.5)
Eosinophils Relative: 0 %
HCT: 35.3 % — ABNORMAL LOW (ref 39.0–52.0)
Hemoglobin: 12 g/dL — ABNORMAL LOW (ref 13.0–17.0)
Immature Granulocytes: 1 %
Lymphocytes Relative: 12 %
Lymphs Abs: 1.2 10*3/uL (ref 0.7–4.0)
MCH: 31.5 pg (ref 26.0–34.0)
MCHC: 34 g/dL (ref 30.0–36.0)
MCV: 92.7 fL (ref 80.0–100.0)
Monocytes Absolute: 0.6 10*3/uL (ref 0.1–1.0)
Monocytes Relative: 6 %
Neutro Abs: 8 10*3/uL — ABNORMAL HIGH (ref 1.7–7.7)
Neutrophils Relative %: 80 %
Platelets: 356 10*3/uL (ref 150–400)
RBC: 3.81 MIL/uL — ABNORMAL LOW (ref 4.22–5.81)
RDW: 12.5 % (ref 11.5–15.5)
WBC: 9.9 10*3/uL (ref 4.0–10.5)
nRBC: 0 % (ref 0.0–0.2)

## 2019-04-03 LAB — BASIC METABOLIC PANEL
Anion gap: 13 (ref 5–15)
BUN: 30 mg/dL — ABNORMAL HIGH (ref 8–23)
CO2: 21 mmol/L — ABNORMAL LOW (ref 22–32)
Calcium: 9.1 mg/dL (ref 8.9–10.3)
Chloride: 100 mmol/L (ref 98–111)
Creatinine, Ser: 1.45 mg/dL — ABNORMAL HIGH (ref 0.61–1.24)
GFR calc Af Amer: 59 mL/min — ABNORMAL LOW (ref >60)
GFR calc non Af Amer: 51 mL/min — ABNORMAL LOW (ref >60)
Glucose, Bld: 144 mg/dL — ABNORMAL HIGH (ref 70–99)
Potassium: 3.9 mmol/L (ref 3.5–5.1)
Sodium: 134 mmol/L — ABNORMAL LOW (ref 135–145)

## 2019-04-03 LAB — SARS CORONAVIRUS 2 BY RT PCR (HOSPITAL ORDER, PERFORMED IN ~~LOC~~ HOSPITAL LAB): SARS Coronavirus 2: NEGATIVE

## 2019-04-03 SURGERY — OPEN REDUCTION INTERNAL FIXATION (ORIF) HAND
Anesthesia: Choice | Site: Hand | Laterality: Left

## 2019-04-03 SURGERY — OPEN REDUCTION INTERNAL FIXATION (ORIF) HAND
Anesthesia: General | Site: Hand | Laterality: Left

## 2019-04-03 MED ORDER — FENTANYL CITRATE (PF) 250 MCG/5ML IJ SOLN
INTRAMUSCULAR | Status: DC | PRN
  Administered 2019-04-03: 100 ug via INTRAVENOUS
  Administered 2019-04-03 (×4): 50 ug via INTRAVENOUS

## 2019-04-03 MED ORDER — PROPOFOL 10 MG/ML IV BOLUS
INTRAVENOUS | Status: DC | PRN
  Administered 2019-04-03: 150 mg via INTRAVENOUS
  Administered 2019-04-03 (×2): 50 mg via INTRAVENOUS

## 2019-04-03 MED ORDER — OXYCODONE HCL 5 MG PO TABS: 5.0000 mg | ORAL_TABLET | Freq: Once | ORAL | Status: DC | PRN

## 2019-04-03 MED ORDER — ONDANSETRON HCL 4 MG/2ML IJ SOLN
INTRAMUSCULAR | Status: DC | PRN
  Administered 2019-04-03: 4 mg via INTRAVENOUS

## 2019-04-03 MED ORDER — OXYCODONE HCL 5 MG PO TABS: 5.0000 mg | ORAL_TABLET | Freq: Four times a day (QID) | ORAL | 0 refills | Status: DC | PRN

## 2019-04-03 MED ORDER — LACTATED RINGERS IV SOLN
INTRAVENOUS | Status: DC | PRN
  Administered 2019-04-03: 1000 mL
  Administered 2019-04-03 (×2): via INTRAVENOUS

## 2019-04-03 MED ORDER — CEFAZOLIN SODIUM-DEXTROSE 1-4 GM/50ML-% IV SOLN
INTRAVENOUS | Status: DC | PRN
  Administered 2019-04-03: 1 g via INTRAVENOUS

## 2019-04-03 MED ORDER — DEXAMETHASONE SODIUM PHOSPHATE 10 MG/ML IJ SOLN
INTRAMUSCULAR | Status: DC | PRN
  Administered 2019-04-03: 10 mg via INTRAVENOUS

## 2019-04-03 MED ORDER — ONDANSETRON HCL 4 MG/2ML IJ SOLN
INTRAMUSCULAR | Status: AC
  Filled 2019-04-03: qty 2

## 2019-04-03 MED ORDER — DEXAMETHASONE SODIUM PHOSPHATE 10 MG/ML IJ SOLN
INTRAMUSCULAR | Status: AC
  Filled 2019-04-03: qty 1

## 2019-04-03 MED ORDER — MIDAZOLAM HCL 2 MG/2ML IJ SOLN
INTRAMUSCULAR | Status: AC
  Filled 2019-04-03: qty 2

## 2019-04-03 MED ORDER — ACETAMINOPHEN 325 MG PO TABS: 650.0000 mg | ORAL_TABLET | Freq: Four times a day (QID) | ORAL | Status: DC

## 2019-04-03 MED ORDER — LIDOCAINE 2% (20 MG/ML) 5 ML SYRINGE
INTRAMUSCULAR | Status: AC
  Filled 2019-04-03: qty 5

## 2019-04-03 MED ORDER — HYDROMORPHONE HCL 1 MG/ML IJ SOLN: 0.2500 mg | INTRAMUSCULAR | Status: DC | PRN

## 2019-04-03 MED ORDER — TETANUS-DIPHTH-ACELL PERTUSSIS 5-2.5-18.5 LF-MCG/0.5 IM SUSP
0.5000 mL | Freq: Once | INTRAMUSCULAR | Status: AC
  Administered 2019-04-03: 0.5 mL via INTRAMUSCULAR
  Filled 2019-04-03: qty 0.5

## 2019-04-03 MED ORDER — LIDOCAINE 2% (20 MG/ML) 5 ML SYRINGE
INTRAMUSCULAR | Status: DC | PRN
  Administered 2019-04-03: 60 mg via INTRAVENOUS

## 2019-04-03 MED ORDER — GABAPENTIN 300 MG PO CAPS: 300.0000 mg | ORAL_CAPSULE | Freq: Three times a day (TID) | ORAL | 2 refills | Status: DC

## 2019-04-03 MED ORDER — BUPIVACAINE-EPINEPHRINE 0.5% -1:200000 IJ SOLN
INTRAMUSCULAR | Status: DC | PRN
  Administered 2019-04-03: 18 mL

## 2019-04-03 MED ORDER — PHENYLEPHRINE 40 MCG/ML (10ML) SYRINGE FOR IV PUSH (FOR BLOOD PRESSURE SUPPORT)
PREFILLED_SYRINGE | INTRAVENOUS | Status: DC | PRN
  Administered 2019-04-03: 80 ug via INTRAVENOUS
  Administered 2019-04-03 (×2): 40 ug via INTRAVENOUS
  Administered 2019-04-03 (×3): 80 ug via INTRAVENOUS

## 2019-04-03 MED ORDER — ROCURONIUM BROMIDE 10 MG/ML (PF) SYRINGE
PREFILLED_SYRINGE | INTRAVENOUS | Status: AC
  Filled 2019-04-03: qty 10

## 2019-04-03 MED ORDER — AMOXICILLIN-POT CLAVULANATE 875-125 MG PO TABS: 1.0000 | ORAL_TABLET | Freq: Two times a day (BID) | ORAL | 0 refills | Status: AC

## 2019-04-03 MED ORDER — CEFAZOLIN SODIUM 1 G IJ SOLR
INTRAMUSCULAR | Status: AC
  Filled 2019-04-03: qty 10

## 2019-04-03 MED ORDER — 0.9 % SODIUM CHLORIDE (POUR BTL) OPTIME
TOPICAL | Status: DC | PRN
  Administered 2019-04-03: 1000 mL

## 2019-04-03 MED ORDER — BACITRACIN ZINC 500 UNIT/GM EX OINT
TOPICAL_OINTMENT | CUTANEOUS | Status: AC
  Filled 2019-04-03: qty 56.7

## 2019-04-03 MED ORDER — SUCCINYLCHOLINE CHLORIDE 200 MG/10ML IV SOSY
PREFILLED_SYRINGE | INTRAVENOUS | Status: DC | PRN
  Administered 2019-04-03: 80 mg via INTRAVENOUS

## 2019-04-03 MED ORDER — PROPOFOL 1000 MG/100ML IV EMUL
INTRAVENOUS | Status: AC
  Filled 2019-04-03: qty 100

## 2019-04-03 MED ORDER — PHENYLEPHRINE 40 MCG/ML (10ML) SYRINGE FOR IV PUSH (FOR BLOOD PRESSURE SUPPORT)
PREFILLED_SYRINGE | INTRAVENOUS | Status: AC
  Filled 2019-04-03: qty 10

## 2019-04-03 MED ORDER — AMOXICILLIN-POT CLAVULANATE 875-125 MG PO TABS: 1.0000 | ORAL_TABLET | Freq: Two times a day (BID) | ORAL | 0 refills | Status: DC

## 2019-04-03 MED ORDER — KETOROLAC TROMETHAMINE 30 MG/ML IJ SOLN: 30.0000 mg | Freq: Once | INTRAMUSCULAR | Status: DC | PRN

## 2019-04-03 MED ORDER — APIXABAN 5 MG PO TABS: ORAL_TABLET | ORAL | 3 refills | Status: DC

## 2019-04-03 MED ORDER — PROPOFOL 10 MG/ML IV BOLUS
INTRAVENOUS | Status: AC
  Filled 2019-04-03: qty 20

## 2019-04-03 MED ORDER — HYDROMORPHONE HCL 1 MG/ML IJ SOLN
1.0000 mg | Freq: Once | INTRAMUSCULAR | Status: AC
  Administered 2019-04-03: 1 mg via INTRAVENOUS
  Filled 2019-04-03: qty 1

## 2019-04-03 MED ORDER — FENTANYL CITRATE (PF) 100 MCG/2ML IJ SOLN
100.0000 ug | Freq: Once | INTRAMUSCULAR | Status: AC
  Administered 2019-04-03: 100 ug via INTRAVENOUS
  Filled 2019-04-03: qty 2

## 2019-04-03 MED ORDER — PHENYLEPHRINE HCL-NACL 10-0.9 MG/250ML-% IV SOLN
INTRAVENOUS | Status: DC | PRN
  Administered 2019-04-03: 20 ug/min via INTRAVENOUS

## 2019-04-03 MED ORDER — FENTANYL CITRATE (PF) 250 MCG/5ML IJ SOLN
INTRAMUSCULAR | Status: AC
  Filled 2019-04-03: qty 5

## 2019-04-03 MED ORDER — ROCURONIUM BROMIDE 10 MG/ML (PF) SYRINGE
PREFILLED_SYRINGE | INTRAVENOUS | Status: DC | PRN
  Administered 2019-04-03: 30 mg via INTRAVENOUS
  Administered 2019-04-03: 20 mg via INTRAVENOUS
  Administered 2019-04-03: 40 mg via INTRAVENOUS

## 2019-04-03 MED ORDER — PROMETHAZINE HCL 25 MG/ML IJ SOLN: 6.2500 mg | INTRAMUSCULAR | Status: DC | PRN

## 2019-04-03 MED ORDER — MIDAZOLAM HCL 5 MG/5ML IJ SOLN
INTRAMUSCULAR | Status: DC | PRN
  Administered 2019-04-03: 2 mg via INTRAVENOUS

## 2019-04-03 MED ORDER — LACTATED RINGERS IV SOLN: INTRAVENOUS | Status: DC

## 2019-04-03 MED ORDER — CEFAZOLIN SODIUM-DEXTROSE 1-4 GM/50ML-% IV SOLN
1.0000 g | Freq: Once | INTRAVENOUS | Status: AC
  Administered 2019-04-03: 1 g via INTRAVENOUS
  Filled 2019-04-03: qty 50

## 2019-04-03 MED ORDER — SODIUM CHLORIDE 0.9 % IV SOLN
INTRAVENOUS | Status: DC | PRN
  Administered 2019-04-03: 500 mL via INTRAVENOUS

## 2019-04-03 MED ORDER — SUCCINYLCHOLINE CHLORIDE 200 MG/10ML IV SOSY
PREFILLED_SYRINGE | INTRAVENOUS | Status: AC
  Filled 2019-04-03: qty 10

## 2019-04-03 MED ORDER — OXYCODONE HCL 5 MG/5ML PO SOLN: 5.0000 mg | Freq: Once | ORAL | Status: DC | PRN

## 2019-04-03 SURGICAL SUPPLY — 65 items
BIT DRILL 1.1 (BIT) ×2
BIT DRILL 60X20X1.1XQC TMX (BIT) ×4 IMPLANT
BIT DRL 60X20X1.1XQC TMX (BIT) ×4
BNDG COHESIVE 1X5 TAN STRL LF (GAUZE/BANDAGES/DRESSINGS) IMPLANT
BNDG COHESIVE 2X5 TAN STRL LF (GAUZE/BANDAGES/DRESSINGS) IMPLANT
BNDG COHESIVE 4X5 TAN STRL (GAUZE/BANDAGES/DRESSINGS) ×3 IMPLANT
BNDG CONFORM 2 STRL LF (GAUZE/BANDAGES/DRESSINGS) IMPLANT
BNDG ESMARK 4X9 LF (GAUZE/BANDAGES/DRESSINGS) ×3 IMPLANT
BNDG GAUZE ELAST 4 BULKY (GAUZE/BANDAGES/DRESSINGS) ×3 IMPLANT
CHLORAPREP W/TINT 26 (MISCELLANEOUS) IMPLANT
CORD BIPOLAR FORCEPS 12FT (ELECTRODE) ×3 IMPLANT
COVER SURGICAL LIGHT HANDLE (MISCELLANEOUS) ×3 IMPLANT
COVER WAND RF STERILE (DRAPES) ×3 IMPLANT
CUFF TOURN SGL QUICK 18X4 (TOURNIQUET CUFF) ×3 IMPLANT
DEPRESSOR TONGUE BLADE STERILE (MISCELLANEOUS) IMPLANT
DRAPE C-ARM 42X72 X-RAY (DRAPES) ×3 IMPLANT
DRAPE HALF SHEET 40X57 (DRAPES) ×3 IMPLANT
DRAPE SURG 17X23 STRL (DRAPES) ×3 IMPLANT
DRSG ADAPTIC 3X8 NADH LF (GAUZE/BANDAGES/DRESSINGS) ×3 IMPLANT
DRSG EMULSION OIL 3X3 NADH (GAUZE/BANDAGES/DRESSINGS) IMPLANT
GAUZE SPONGE 4X4 12PLY STRL (GAUZE/BANDAGES/DRESSINGS) ×3 IMPLANT
GLOVE BIO SURGEON STRL SZ 6.5 (GLOVE) ×3 IMPLANT
GLOVE BIO SURGEON STRL SZ7.5 (GLOVE) ×3 IMPLANT
GLOVE BIOGEL PI IND STRL 7.0 (GLOVE) ×2 IMPLANT
GLOVE BIOGEL PI IND STRL 8 (GLOVE) ×2 IMPLANT
GLOVE BIOGEL PI INDICATOR 7.0 (GLOVE) ×1
GLOVE BIOGEL PI INDICATOR 8 (GLOVE) ×1
GOWN STRL REUS W/ TWL LRG LVL3 (GOWN DISPOSABLE) ×4 IMPLANT
GOWN STRL REUS W/ TWL XL LVL3 (GOWN DISPOSABLE) ×2 IMPLANT
GOWN STRL REUS W/TWL LRG LVL3 (GOWN DISPOSABLE) ×2
GOWN STRL REUS W/TWL XL LVL3 (GOWN DISPOSABLE) ×1
K-WIRE .045X6 DBL TRO NS (WIRE) ×3
K-WIRE DBL TROCAR .035X4 IMPLANT
K-WIRE DBL TROCAR .045X4 IMPLANT
KIT BASIN OR (CUSTOM PROCEDURE TRAY) ×3 IMPLANT
KIT TURNOVER KIT B (KITS) ×3 IMPLANT
KWIRE .045X6 DBL TRO NS (WIRE) ×2 IMPLANT
KWIRE DBL TROCAR .035X4 IMPLANT
KWIRE DBL TROCAR .045X4 IMPLANT
LOCK SCREW 1.5X15MM (Screw) ×3 IMPLANT
NEEDLE HYPO 25X1 1.5 SAFETY (NEEDLE) ×3 IMPLANT
NS IRRIG 1000ML POUR BTL (IV SOLUTION) ×3 IMPLANT
PACK ORTHO EXTREMITY (CUSTOM PROCEDURE TRAY) ×3 IMPLANT
PAD CAST 4YDX4 CTTN HI CHSV (CAST SUPPLIES) ×2 IMPLANT
PADDING CAST COTTON 4X4 STRL (CAST SUPPLIES) ×1
PLATE LOCK WEB 1.5 SM (Plate) ×3 IMPLANT
PLATE TYL 1.5 (Plate) ×3 IMPLANT
SCREW L 1.5X12 (Screw) ×6 IMPLANT
SCREW L 1.5X14 (Screw) ×12 IMPLANT
SCREW LOCK 1.5X15MM (Screw) ×2 IMPLANT
SCREW LOCKING 1.5X10 (Screw) ×3 IMPLANT
SCREW LOCKING 1.5X13MM (Screw) ×9 IMPLANT
SCREW LOCKING 1.5X16 (Screw) ×9 IMPLANT
SCREW LOCKING 1.5X8 (Screw) ×12 IMPLANT
SLING ARM FOAM STRAP LRG (SOFTGOODS) ×3 IMPLANT
SUT CHROMIC 6 0 PS 4 (SUTURE) IMPLANT
SUT ETHIBOND 4 0 TF (SUTURE) ×3 IMPLANT
SUT ETHILON 4 0 PS 2 18 (SUTURE) IMPLANT
SUT VIC AB 3-0 FS2 27 (SUTURE) ×3 IMPLANT
SUT VICRYL RAPIDE 4/0 PS 2 (SUTURE) ×15 IMPLANT
SYR 10ML LL (SYRINGE) ×3 IMPLANT
TOWEL GREEN STERILE (TOWEL DISPOSABLE) ×3 IMPLANT
TUBE CONNECTING 20X1/4 (TUBING) ×3 IMPLANT
UNDERPAD 30X30 (UNDERPADS AND DIAPERS) ×3 IMPLANT
WIRE K .035MM 164206035 (MISCELLANEOUS) ×12 IMPLANT

## 2019-04-03 NOTE — Transfer of Care (Signed)
Immediate Anesthesia Transfer of Care Note  Patient: Theodore Weber  Procedure(s) Performed: OPEN REDUCTION INTERNAL FIXATION  LEFT HAND LONG AND INDEX FINGERS, REPAIR MULTIPLE HAND LACERATIONS (Left Hand) EXTENSOR TENDON REPAIR LONG AND INDEX FINGERS LEFT HAND (Hand)  Patient Location: PACU  Anesthesia Type:General  Level of Consciousness: drowsy and patient cooperative  Airway & Oxygen Therapy: Patient Spontanous Breathing and Patient connected to nasal cannula oxygen  Post-op Assessment: Report given to RN, Post -op Vital signs reviewed and stable and Patient moving all extremities  Post vital signs: Reviewed and stable  Last Vitals:  Vitals Value Taken Time  BP 113/70 04/03/19 1654  Temp    Pulse 82 04/03/19 1654  Resp 17 04/03/19 1654  SpO2 99 % 04/03/19 1654  Vitals shown include unvalidated device data.  Last Pain:  Vitals:   04/03/19 1313  TempSrc:   PainSc: 9          Complications: No apparent anesthesia complications

## 2019-04-03 NOTE — Anesthesia Procedure Notes (Signed)
Procedure Name: Intubation Date/Time: 04/03/2019 1:46 PM Performed by: Harden Mo, CRNA Pre-anesthesia Checklist: Patient identified, Emergency Drugs available, Suction available and Patient being monitored Patient Re-evaluated:Patient Re-evaluated prior to induction Oxygen Delivery Method: Circle System Utilized Preoxygenation: Pre-oxygenation with 100% oxygen Induction Type: IV induction and Rapid sequence Laryngoscope Size: Mac and 3 Grade View: Grade I Tube type: Oral Tube size: 7.5 mm Number of attempts: 1 Airway Equipment and Method: Stylet and Oral airway Placement Confirmation: ETT inserted through vocal cords under direct vision,  positive ETCO2 and breath sounds checked- equal and bilateral Secured at: 23 cm Tube secured with: Tape Dental Injury: Teeth and Oropharynx as per pre-operative assessment

## 2019-04-03 NOTE — ED Triage Notes (Signed)
Piece of steel fell onto pts left hand.  Index finger completely flexed and appears fractured.  Large open wound to back of left hand. Bleeding appears controlled with pressure.

## 2019-04-03 NOTE — ED Notes (Signed)
Attempted to call report to [facility] ED.  Called x3 and was on hold for 12 min but no one could talk to me.  Will call back.

## 2019-04-03 NOTE — Anesthesia Preprocedure Evaluation (Deleted)
Anesthesia Evaluation    Reviewed: Allergy & Precautions, Patient's Chart, lab work & pertinent test results  Airway        Dental   Pulmonary neg pulmonary ROS, former smoker,           Cardiovascular hypertension, Pt. on medications +CHF  + dysrhythmias Atrial Fibrillation   ECG: rate 70. Normal sinus rhythm Rightward axis  ECHO: Left ventricle: The cavity size was normal. Wall thickness was normal. Systolic function was mildly reduced. The estimated ejection fraction was in the range of 45% to 50%. Diffuse hypokinesis. The study is not technically sufficient to allow evaluation of LV diastolic function. Left atrium: The atrium was normal in size. Right atrium: The atrium was mildly dilated. Tricuspid valve: There was trivial regurgitation. Pulmonary arteries: PA peak pressure: 26 mm Hg (S). Inferior vena cava: The vessel was dilated, but collapses >50%, suggesting an elevated RA pressure of 8 mmHg.   Neuro/Psych PSYCHIATRIC DISORDERS negative neurological ROS     GI/Hepatic negative GI ROS, Neg liver ROS,   Endo/Other  Hypothyroidism   Renal/GU negative Renal ROS     Musculoskeletal negative musculoskeletal ROS (+)   Abdominal   Peds  Hematology  (+) anemia ,   Anesthesia Other Findings Left hand trauma  Reproductive/Obstetrics                             Anesthesia Physical Anesthesia Plan Anesthesia Quick Evaluation

## 2019-04-03 NOTE — Op Note (Signed)
04/03/2019  1:03 PM  PATIENT:  Theodore Weber  63 y.o. male  PRE-OPERATIVE DIAGNOSIS:  Crush injury to left hand with multiple injured structures  POST-OPERATIVE DIAGNOSIS:  Same  PROCEDURE: L IF:  1.  Excisional debridement of skin, subcutaneous tissues, tendon, and bone associated with open fracture    2.  Open treatment of multiply comminuted proximal phalanx fracture, including its distal intra-articular component    3.  A2 pulley repair using local tissue    4.  Extensor tendon repair, zone 4    5.  Complex closure traumatic laceration, 11 cm   L LF:    1.  Excisional debridement of skin, subcutaneous tissues, tendon, and bone associated with open fracture    2.  Open treatment of comminuted proximal phalanx fracture, including proximal intra-articular component    3.  Extensor tendon repair, zone 5    4.  Complex closure traumatic laceration, 6 cm   L hand:    1.  Excisional debridement of skin and subcutaneous tissues    2.  Intermediate closure of traumatic laceration, 10 cm  SURGEON: Rayvon Char. Grandville Silos, MD  PHYSICIAN ASSISTANT: Morley Kos, OPA-C  ANESTHESIA:  general  SPECIMENS:  None  DRAINS:   None  EBL:  less than 50 mL  PREOPERATIVE INDICATIONS:  Theodore Weber is a  63 y.o. male with a crush injury to the left hand, resulting in multitissue injuries to the index and long fingers, as well as lacerations of the dorsum of the hand.  The risks benefits and alternatives were discussed with the patient preoperatively including but not limited to the risks of infection, bleeding, nerve injury, cardiopulmonary complications, the need for revision surgery, among others, and the patient verbalized understanding and consented to proceed.  OPERATIVE IMPLANTS: Biomet ALPS hand set 1.26mm plates x 2 (small T and T-Y), 0.035 k-wire x 1  OPERATIVE PROCEDURE:  The patient was escorted to the operative theatre and placed in a supine position.  General anesthesia was  administered.  Another gram of IV Ancef was administered.  The hand and forearm were prescrubbed with a Hibiclens scrub brush.  A surgical "time-out" was performed during which the planned procedure, proposed operative site, and the correct patient identity were compared to the operative consent and agreement confirmed by the circulating nurse according to current facility policy.  Following application of a tourniquet to the operative extremity, the exposed skin was prepped with Betadine and draped in the usual sterile fashion.  The limb was exsanguinated with gravity and the tourniquet inflated to approximately 156mmHg higher than systolic BP.  The extent of the damage of each digit was first surveyed.  Then each wound was copiously irrigated and measured.  The volar skin flap of the index finger was particularly thin, largely degloved.  The radial and ulnar neurovascular bundles of both digits appeared to be longitudinally in continuity.  The proximal phalanx of the index finger was badly comminuted, including a distal intra-articular component, and the long finger proximal phalanx not so badly comminuted, still with the proximal intra-articular component.  Extensor tendon to each digit had been injured.  On the index finger, it was shredded over the midportion of the proximal phalanx and turned back, much like a Chamay approach.  The long finger was more transversely divided at the proximal phalangeal fracture site.  After the damage was surveyed and a tentative plan formulated.  Attention was directed to excisional debridement as referenced above.  For the dorsal  hand wound, skin and subcutaneous tissues were debrided, removing particularly jagged and thin nonviable edges.  For the index and the long fingers, the debridement included the extensor tendon and bone.  Couple of small pieces of bone lacking soft tissue attachment were removed from each digit.  Following wound preparation in this manner,  attention was directed first to the long finger.  The traumatic wound was exploited with proximal dorsal extension, and the extensor tendon was split longitudinally distal to the transverse extensor laceration.  The wound was irrigated, the fracture provisionally reduced and secured with a T plate.  The alignment was near-anatomic.  There was an intra-articular split, where the radial most portion of the articular surface was split from the remainder of the surface with a sagittal split.  In part this was not secured with the plate, but held instead with a cerclage suture.  Attention was given to ensuring the touchdown point for the long finger was nicely adjacent to the ring finger.  After skeletal fixation of the long finger, attention was shifted to the index finger.  The traumatic wounds could not be so easily exploited, and a dorsal linear longitudinal incision was made in the midline, but not the whole length of the dorsal flap, leaving the distal portion still intact.  Working through this window, as well as the distal traumatic wound, skeletal fixation was achieved.  The distal intra-articular component was first reduced and then secured with a 0.035 inch K wire which was ultimately kept.  Next, all the pieces were provisionally reduced, and a T-Y plate selected.  It was used to bridge of the long segment of comminution, essentially helping to secure the distal fragments to the base fragments.  Cerclage suturing was placed around the midportion, helping to secure the longitudinal fragments to the construct.  The K wire that of been placed provisionally was now driven out through the skin on the radial side where ultimately was bent over and clipped.  Again, attention was paid to ensuring that the index finger touchdown point was nicely adjacent to the long finger.  Final fluoroscopic images were obtained.  The wounds were again irrigated and attention directed to soft tissue repairs.  4-0 Ethibond suture  was used to perform an A2 pulley repair to the index, using local tissue.  In addition the same suture type was used to repair the traumatic extensor tendon lacerations as well as the surgically created central splits in the tendons.  For the long finger, not only was the central portion of the tendon repaired, but the transverse rent in the sagittal bands on either side were repaired with 4-0 Ethibond suture.  The index finger extensor was quite shredded, and really only allowed for a central portion to be repaired such that the tendon was longitudinally in continuity.  The tourniquet was released at this point, after about 2 hours and 10 minutes of being inflated.  The wounds were irrigated.  Attention was directed to skin closure.  Not only were the surgically created incisions closed with 4-0 Vicryl Rapide interrupted suture, so over the traumatic wounds.  Closure was left incomplete and less tight on the ulnar side of the long finger, where essentially a mid axial line laceration had been created.  This was to try to avoid closure which was too tight and jeopardized viability of the digit.  For the index finger, closure was loose and incomplete as well.  The dorsal hand wound was closed in a similar fashion, achieving  full closure.  Half percent plain Marcaine was instilled at the level of the wrist, attempting to create a median, superficial radial, and dorsal ulnar cutaneous nerve branch blocks.  The hand was then cleansed, the traumatic wounds dressed with bacitracin ointment, Adaptic, and a bulky hand dressing with a volar plaster component.  At this point, all of the digital tips had capillary refill less than 1 second and were similarly colored.  He was awakened and taken to recovery in stable condition, breathing spontaneously.  DISPOSITION: He will be discharged home today with typical instructions, analgesic plan that also includes gabapentin and vitamin C, with follow-up in the office next  Tuesday in a week.

## 2019-04-03 NOTE — Discharge Instructions (Signed)
Discharge Instructions   You have a dressing with a plaster splint incorporated in it. Use the sling to help keep your hand elevated. Move your fingers as much as possible, making a full fist and fully opening the fist. Elevate your hand to reduce pain & swelling of the digits.  Ice over the operative site and /or in your arm pit may be helpful to reduce pain & swelling.  DO NOT USE HEAT. Pain medicine has been prescribed for you.  Take Tylenol 650 mg every 6 hours. Take the Oxycodone 5 mg additionally for severe break through pain.Take the Gabapentin 300 mg 3x per day in addition to the Tylenol. Take Vitamin C, 1000 mg daily. Leave the dressing in place until you return to our office.  You may shower, but keep the bandage clean & dry.  You may drive a car when you are off of prescription pain medications and can safely control your vehicle with both hands. Our office will call you to arrange follow-up   Please call [phone] during normal business hours or [phone] after hours for any problems. Including the following:  - excessive redness of the incisions - drainage for more than 4 days - fever of more than 101.5 F  *Please note that pain medications will not be refilled after hours or on weekends.  WORK STATUS: OUT OF WORK UNTIL YOUR FIRST POST OPERATIVE APPOINTMENT.

## 2019-04-03 NOTE — H&P (View-Only) (Signed)
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  I viewed the clinical photos and xrays and began to orchestrate definitive operative care, including xfer to [facility] for such.  He reports it is becoming more painful now that he has arrived at [facility].  Past Medical History:  Diagnosis Date   Atrial fibrillation (Theodore Weber) 12/2016   Cancer ([place])    HOH (hard of hearing)    Hypertension    Renal mass [id number]   Systolic dysfunction with acute on chronic heart failure ([place]) 01/15/2017   Past Surgical History:  Procedure Laterality Date   CARDIOVERSION N/A 01/15/2017   Procedure: CARDIOVERSION;  Surgeon: Jolaine Artist, MD;  Location: [facility] ENDOSCOPY;  Service: Cardiovascular;  Laterality: N/A;   COLONOSCOPY  2012 ?   HYPOSPADIAS CORRECTION     at age 74    LAPAROSCOPIC NEPHRECTOMY Right 03/15/2017   Procedure: LAPAROSCOPIC  RADICAL NEPHRECTOMY;  Surgeon: Raynelle Bring, MD;  Location: [facility] ORS;  Service: Urology;  Laterality: Right;   RIGHT/LEFT HEART CATH AND CORONARY ANGIOGRAPHY N/A 01/14/2017   Procedure: RIGHT/LEFT HEART CATH AND CORONARY ANGIOGRAPHY;  Surgeon: Troy Sine, MD;  Location: [place] CV LAB;  Service: Cardiovascular;  Laterality: N/A;   TEE WITHOUT CARDIOVERSION N/A 01/15/2017   Procedure: TRANSESOPHAGEAL ECHOCARDIOGRAM (TEE);  Surgeon: Jolaine Artist, MD;  Location: [facility] ENDOSCOPY;  Service: Cardiovascular;  Laterality: N/A;   Social History   Socioeconomic History   Marital status: Married    Spouse name: Not on file   Number of children: Not on file   Years of education: Not on file   Highest education level: Not on file  Occupational History   Not on file  Social Needs    Financial resource strain: Not on file   Food insecurity    Worry: Not on file    Inability: Not on file   Transportation needs    Medical: Not on file    Non-medical: Not on file  Tobacco Use   Smoking status: Former Smoker    Types: Cigars    Quit date: 01/09/2017    Years since quitting: 2.2   Smokeless tobacco: Current User  Substance and Sexual Activity   Alcohol use: Yes    Alcohol/week: 3.0 standard drinks    Types: 3 Cans of beer per week    Comment: Occasionallyh   Drug use: No   Sexual activity: Not on file  Lifestyle   Physical activity    Days per week: Not on file    Minutes per session: Not on file   Stress: Not on file  Relationships   Social connections    Talks on phone: Not on file    Gets together: Not on file    Attends religious service: Not on file    Active member of club or organization: Not on file    Attends meetings of clubs or organizations: Not on file    Relationship status: Not on file  Other Topics Concern   Not on file  Social History Narrative   Not on file   Family History  Problem Relation Age of Onset   Hypertension Father    No Known Allergies Prior to Admission medications  Medication Sig Start Date End Date Taking? Authorizing Provider  amiodarone (PACERONE) 200 MG tablet TAKE 1/2 TABLET(100MG  TOTAL) BY MOUTH DAILY 01/02/19   Bensimhon, Shaune Pascal, MD  apixaban (ELIQUIS) 5 MG TABS tablet TAKE 1 TABLET(5 MG) BY MOUTH TWICE DAILY 02/21/19   Bensimhon, Shaune Pascal, MD  furosemide (LASIX) 40 MG tablet Take 1 tablet (40 mg total) by mouth daily as needed (For weight gain 3 lbs or more overnight, 5 lbs or more in 1 week). 12/19/18   Bensimhon, Shaune Pascal, MD  levothyroxine (SYNTHROID, LEVOTHROID) 75 MCG tablet Take 75 mcg by mouth daily before breakfast.    [provider]  losartan (COZAAR) 25 MG tablet TAKE 1 TABLET BY MOUTH EVERY DAY 01/16/19   Bensimhon, Shaune Pascal, MD  OIL OF OREGANO PO Take 1 each by mouth See admin  instructions. Take 1 dropper full by mouth daily    [provider]  spironolactone (ALDACTONE) 25 MG tablet Take 0.5 tablets (12.5 mg total) by mouth at bedtime. 02/14/19   Bensimhon, Shaune Pascal, MD   Dg Hand Complete Left  Result Date: 04/03/2019 CLINICAL DATA:  Left hand injury EXAM: LEFT HAND - COMPLETE 3+ VIEW COMPARISON:  None. FINDINGS: There is a transverse, distracted fracture of the proximal aspect left second proximal phalanx. There is an additional fracture component of the distal aspect of the left second proximal phalanx, with nonanatomic flexion of the digit about this fracture and open overlying laceration. The middle and distal phalanges are intact. There is an additional comminuted and displaced fracture of the proximal aspect proximal left third phalanx which clearly extends into the metacarpophalangeal joint. Open lacerations overlying the dorsum of the left hand and proximal second and third digits. No other fracture or dislocation. Punctuate metallic foreign bodies noted in the soft tissues of the distal left third and proximal left fourth digits. IMPRESSION: 1. There is a transverse, distracted fracture of the proximal aspect left second proximal phalanx. There is an additional fracture component of the distal aspect of the left second proximal phalanx, with nonanatomic flexion of the digit about this fracture and open overlying laceration. The middle and distal phalanges are intact. 2. There is an additional comminuted and displaced fracture of the proximal aspect proximal left third phalanx which clearly extends into the metacarpophalangeal joint. 3. Open lacerations overlying the dorsum of the left hand and proximal second and third digits. No other fracture or dislocation. 4. Punctuate metallic foreign bodies noted in the soft tissues of the distal left third and proximal left fourth digits. Electronically Signed   By: Eddie Candle M.D.   On: 04/03/2019 10:34    Positive ROS:  All other systems have been reviewed and were otherwise negative with the exception of those mentioned in the HPI and as above.  Physical Exam: Vitals: Refer to EMR. Constitutional:  WD, WN, NAD HEENT:  NCAT, EOMI Neuro/Psych:  Alert & oriented to person, place, and time; appropriate mood & affect Lymphatic: No generalized extremity edema or lymphadenopathy Extremities / MSK:  The extremities are normal with respect to appearance, ranges of motion, joint stability, muscle strength/tone, sensation, & perfusion except as otherwise noted:  L hand with mangling injury, including dorsal hand lacerations, segmental fx of IF P1 and comminuted fx of LF P1, with multiple digital lacerations as well.  The tip of the index finger, although clenched into the palm, does not appear cold.  The skin of the hand is dirty, but he reports that he can discern  pinch at the pulp, but I am unable to better determine the status of each side neurovascular bundle.  There is a large open wound on the dorsum of the index at the level of the PIP with protruding deeper structures.  Assessment: Crush injury to left hand, with fractures of the IF and LF and multiple lacerations of hand /digits  Plan: Tetanus UTD, and IV ancef already provided at [facility].  D/w patient the severity of this injury and need for operative intervention to further survey the damage and proceed with repair of injured structures as indicated.  I also discussed with him possible need for IF amputation, although it is not definitively planned to proceed in this manner at this time, but could be a delayed occurrence.  He consented to proceed.  Rayvon Char [address] S[address], [address] [place], [place]  [address] Office: [phone] Mobile: [phone]  04/03/2019, 11:08 AM

## 2019-04-03 NOTE — ED Notes (Signed)
ED Provider at bedside. 

## 2019-04-03 NOTE — Consult Note (Signed)
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  I viewed the clinical photos and xrays and began to orchestrate definitive operative care, including xfer to [facility] for such.  He reports it is becoming more painful now that he has arrived at [facility].  Past Medical History:  Diagnosis Date   Atrial fibrillation ([place]) 12/2016   Cancer ([place])    HOH (hard of hearing)    Hypertension    Renal mass [id number]   Systolic dysfunction with acute on chronic heart failure ([place]) 01/15/2017   Past Surgical History:  Procedure Laterality Date   CARDIOVERSION N/A 01/15/2017   Procedure: CARDIOVERSION;  Surgeon: Jolaine Artist, MD;  Location: [facility] ENDOSCOPY;  Service: Cardiovascular;  Laterality: N/A;   COLONOSCOPY  2012 ?   HYPOSPADIAS CORRECTION     at age 58    LAPAROSCOPIC NEPHRECTOMY Right 03/15/2017   Procedure: LAPAROSCOPIC  RADICAL NEPHRECTOMY;  Surgeon: Raynelle Bring, MD;  Location: [facility] ORS;  Service: Urology;  Laterality: Right;   RIGHT/LEFT HEART CATH AND CORONARY ANGIOGRAPHY N/A 01/14/2017   Procedure: RIGHT/LEFT HEART CATH AND CORONARY ANGIOGRAPHY;  Surgeon: Troy Sine, MD;  Location: [place] CV LAB;  Service: Cardiovascular;  Laterality: N/A;   TEE WITHOUT CARDIOVERSION N/A 01/15/2017   Procedure: TRANSESOPHAGEAL ECHOCARDIOGRAM (TEE);  Surgeon: Jolaine Artist, MD;  Location: [facility] ENDOSCOPY;  Service: Cardiovascular;  Laterality: N/A;   Social History   Socioeconomic History   Marital status: Married    Spouse name: Not on file   Number of children: Not on file   Years of education: Not on file   Highest education level: Not on file  Occupational History   Not on file  Social Needs    Financial resource strain: Not on file   Food insecurity    Worry: Not on file    Inability: Not on file   Transportation needs    Medical: Not on file    Non-medical: Not on file  Tobacco Use   Smoking status: Former Smoker    Types: Cigars    Quit date: 01/09/2017    Years since quitting: 2.2   Smokeless tobacco: Current User  Substance and Sexual Activity   Alcohol use: Yes    Alcohol/week: 3.0 standard drinks    Types: 3 Cans of beer per week    Comment: Occasionallyh   Drug use: No   Sexual activity: Not on file  Lifestyle   Physical activity    Days per week: Not on file    Minutes per session: Not on file   Stress: Not on file  Relationships   Social connections    Talks on phone: Not on file    Gets together: Not on file    Attends religious service: Not on file    Active member of club or organization: Not on file    Attends meetings of clubs or organizations: Not on file    Relationship status: Not on file  Other Topics Concern   Not on file  Social History Narrative   Not on file   Family History  Problem Relation Age of Onset   Hypertension Father    No Known Allergies Prior to Admission medications  Medication Sig Start Date End Date Taking? Authorizing Provider  amiodarone (PACERONE) 200 MG tablet TAKE 1/2 TABLET(100MG  TOTAL) BY MOUTH DAILY 01/02/19   Bensimhon, Shaune Pascal, MD  apixaban (ELIQUIS) 5 MG TABS tablet TAKE 1 TABLET(5 MG) BY MOUTH TWICE DAILY 02/21/19   Bensimhon, Shaune Pascal, MD  furosemide (LASIX) 40 MG tablet Take 1 tablet (40 mg total) by mouth daily as needed (For weight gain 3 lbs or more overnight, 5 lbs or more in 1 week). 12/19/18   Bensimhon, Shaune Pascal, MD  levothyroxine (SYNTHROID, LEVOTHROID) 75 MCG tablet Take 75 mcg by mouth daily before breakfast.    [provider]  losartan (COZAAR) 25 MG tablet TAKE 1 TABLET BY MOUTH EVERY DAY 01/16/19   Bensimhon, Shaune Pascal, MD  OIL OF OREGANO PO Take 1 each by mouth See admin  instructions. Take 1 dropper full by mouth daily    [provider]  spironolactone (ALDACTONE) 25 MG tablet Take 0.5 tablets (12.5 mg total) by mouth at bedtime. 02/14/19   Bensimhon, Shaune Pascal, MD   Dg Hand Complete Left  Result Date: 04/03/2019 CLINICAL DATA:  Left hand injury EXAM: LEFT HAND - COMPLETE 3+ VIEW COMPARISON:  None. FINDINGS: There is a transverse, distracted fracture of the proximal aspect left second proximal phalanx. There is an additional fracture component of the distal aspect of the left second proximal phalanx, with nonanatomic flexion of the digit about this fracture and open overlying laceration. The middle and distal phalanges are intact. There is an additional comminuted and displaced fracture of the proximal aspect proximal left third phalanx which clearly extends into the metacarpophalangeal joint. Open lacerations overlying the dorsum of the left hand and proximal second and third digits. No other fracture or dislocation. Punctuate metallic foreign bodies noted in the soft tissues of the distal left third and proximal left fourth digits. IMPRESSION: 1. There is a transverse, distracted fracture of the proximal aspect left second proximal phalanx. There is an additional fracture component of the distal aspect of the left second proximal phalanx, with nonanatomic flexion of the digit about this fracture and open overlying laceration. The middle and distal phalanges are intact. 2. There is an additional comminuted and displaced fracture of the proximal aspect proximal left third phalanx which clearly extends into the metacarpophalangeal joint. 3. Open lacerations overlying the dorsum of the left hand and proximal second and third digits. No other fracture or dislocation. 4. Punctuate metallic foreign bodies noted in the soft tissues of the distal left third and proximal left fourth digits. Electronically Signed   By: Eddie Candle M.D.   On: 04/03/2019 10:34    Positive ROS:  All other systems have been reviewed and were otherwise negative with the exception of those mentioned in the HPI and as above.  Physical Exam: Vitals: Refer to EMR. Constitutional:  WD, WN, NAD HEENT:  NCAT, EOMI Neuro/Psych:  Alert & oriented to person, place, and time; appropriate mood & affect Lymphatic: No generalized extremity edema or lymphadenopathy Extremities / MSK:  The extremities are normal with respect to appearance, ranges of motion, joint stability, muscle strength/tone, sensation, & perfusion except as otherwise noted:  L hand with mangling injury, including dorsal hand lacerations, segmental fx of IF P1 and comminuted fx of LF P1, with multiple digital lacerations as well.  The tip of the index finger, although clenched into the palm, does not appear cold.  The skin of the hand is dirty, but he reports that he can discern  pinch at the pulp, but I am unable to better determine the status of each side neurovascular bundle.  There is a large open wound on the dorsum of the index at the level of the PIP with protruding deeper structures.  Assessment: Crush injury to left hand, with fractures of the IF and LF and multiple lacerations of hand /digits  Plan: Tetanus UTD, and IV ancef already provided at [facility].  D/w patient the severity of this injury and need for operative intervention to further survey the damage and proceed with repair of injured structures as indicated.  I also discussed with him possible need for IF amputation, although it is not definitively planned to proceed in this manner at this time, but could be a delayed occurrence.  He consented to proceed.  Rayvon Char [address] Silos, [place], [place]  [address] Office: [phone] Mobile: [phone]  04/03/2019, 11:08 AM

## 2019-04-03 NOTE — ED Notes (Signed)
Patient transported to X-ray 

## 2019-04-03 NOTE — ED Provider Notes (Signed)
[facility] EMERGENCY DEPARTMENT Provider Note   CSN: [id number] Arrival date & time: 04/03/19  [id number]     History   Chief Complaint Chief Complaint  Patient presents with  . Hand Injury    HPI Theodore Weber is a 63 y.o. male with history of atrial fibrillation anticoagulated on Eliquis, hypertension, CHF, alcohol abuse who presents with left hand injury that occurred at work.  Patient reports a heavy metal tubing fell on his hand.  He has open wound to his dorsal aspect of the hand as well as in visible, open fracture to the left index finger.  He also has deformity to his third finger.  He denies any wrist pain.  Tetanus is not up-to-date.  Patient denies any other injuries.     HPI  Past Medical History:  Diagnosis Date  . Atrial fibrillation ([place]) 12/2016  . Cancer ([place])   . HOH (hard of hearing)   . Hypertension   . Renal mass 12/2016  . Systolic dysfunction with acute on chronic heart failure ([place]) 01/15/2017    Patient Active Problem List   Diagnosis Date Noted  . Paroxysmal atrial fibrillation ([place]) 04/01/2017  . Chronic systolic heart failure ([place]) 04/01/2017  . ETOH abuse 04/01/2017  . Renal mass 03/15/2017  . Malnutrition of moderate degree 01/14/2017  . NICM (nonischemic cardiomyopathy) ([place])   . Essential hypertension 01/13/2017    Past Surgical History:  Procedure Laterality Date  . CARDIOVERSION N/A 01/15/2017   Procedure: CARDIOVERSION;  Surgeon: Jolaine Artist, MD;  Location: [facility] ENDOSCOPY;  Service: Cardiovascular;  Laterality: N/A;  . COLONOSCOPY  2012 ?  Marland Kitchen HYPOSPADIAS CORRECTION     at age 63   . LAPAROSCOPIC NEPHRECTOMY Right 03/15/2017   Procedure: LAPAROSCOPIC  RADICAL NEPHRECTOMY;  Surgeon: Raynelle Bring, MD;  Location: [facility] ORS;  Service: Urology;  Laterality: Right;  . RIGHT/LEFT HEART CATH AND CORONARY ANGIOGRAPHY N/A 01/14/2017   Procedure: RIGHT/LEFT HEART CATH AND CORONARY ANGIOGRAPHY;  Surgeon: Troy Sine, MD;   Location: [facility] CV LAB;  Service: Cardiovascular;  Laterality: N/A;  . TEE WITHOUT CARDIOVERSION N/A 01/15/2017   Procedure: TRANSESOPHAGEAL ECHOCARDIOGRAM (TEE);  Surgeon: Jolaine Artist, MD;  Location: [facility] ENDOSCOPY;  Service: Cardiovascular;  Laterality: N/A;        Home Medications    Prior to Admission medications   Medication Sig Start Date End Date Taking? Authorizing Provider  amiodarone (PACERONE) 200 MG tablet TAKE 1/2 TABLET(100MG  TOTAL) BY MOUTH DAILY 01/02/19   Bensimhon, Shaune Pascal, MD  apixaban (ELIQUIS) 5 MG TABS tablet TAKE 1 TABLET(5 MG) BY MOUTH TWICE DAILY 02/21/19   Bensimhon, Shaune Pascal, MD  furosemide (LASIX) 40 MG tablet Take 1 tablet (40 mg total) by mouth daily as needed (For weight gain 3 lbs or more overnight, 5 lbs or more in 1 week). 12/19/18   Bensimhon, Shaune Pascal, MD  levothyroxine (SYNTHROID, LEVOTHROID) 75 MCG tablet Take 75 mcg by mouth daily before breakfast.    [provider]  losartan (COZAAR) 25 MG tablet TAKE 1 TABLET BY MOUTH EVERY DAY 01/16/19   Bensimhon, Shaune Pascal, MD  OIL OF OREGANO PO Take 1 each by mouth See admin instructions. Take 1 dropper full by mouth daily    [provider]  spironolactone (ALDACTONE) 25 MG tablet Take 0.5 tablets (12.5 mg total) by mouth at bedtime. 02/14/19   Bensimhon, Shaune Pascal, MD    Family History Family History  Problem Relation Age of Onset  .  Hypertension Father     Social History Social History   Tobacco Use  . Smoking status: Former Smoker    Types: Cigars    Quit date: 01/09/2017    Years since quitting: 2.2  . Smokeless tobacco: Current User  Substance Use Topics  . Alcohol use: Yes    Alcohol/week: 3.0 standard drinks    Types: 3 Cans of beer per week    Comment: Occasionallyh  . Drug use: No     Allergies   Patient has no known allergies.   Review of Systems Review of Systems  Constitutional: Negative for chills and fever.  HENT: Negative for facial swelling and sore  throat.   Respiratory: Negative for shortness of breath.   Cardiovascular: Negative for chest pain.  Gastrointestinal: Negative for abdominal pain, nausea and vomiting.  Genitourinary: Negative for dysuria.  Musculoskeletal: Positive for arthralgias. Negative for back pain.  Skin: Positive for wound. Negative for rash.  Neurological: Negative for headaches.  Psychiatric/Behavioral: The patient is not nervous/anxious.      Physical Exam Updated Vital Signs BP 133/83 (BP Location: Right Arm)   Pulse 86   Temp 98.8 F (37.1 C) (Oral)   Resp 16   Ht 6\' 3"  (1.905 m)   Wt 83 kg   SpO2 98%   BMI 22.87 kg/m   Physical Exam Vitals signs and nursing note reviewed.  Constitutional:      General: He is not in acute distress.    Appearance: He is well-developed. He is not diaphoretic.  HENT:     Head: Normocephalic and atraumatic.     Mouth/Throat:     Pharynx: No oropharyngeal exudate.  Eyes:     General: No scleral icterus.       Right eye: No discharge.        Left eye: No discharge.     Conjunctiva/sclera: Conjunctivae normal.     Pupils: Pupils are equal, round, and reactive to light.  Neck:     Musculoskeletal: Normal range of motion and neck supple.     Thyroid: No thyromegaly.  Cardiovascular:     Rate and Rhythm: Normal rate and regular rhythm.     Heart sounds: Normal heart sounds. No murmur. No friction rub. No gallop.   Pulmonary:     Effort: Pulmonary effort is normal. No respiratory distress.     Breath sounds: Normal breath sounds. No stridor. No wheezing or rales.  Abdominal:     General: Bowel sounds are normal. There is no distension.     Palpations: Abdomen is soft.     Tenderness: There is no abdominal tenderness. There is no guarding or rebound.  Musculoskeletal:     Comments: Left index finger is open over the PIP and stuck in flexion; no sensation to the palmar aspect distal to the DIP, patient does have some sensation on the dorsal aspect, large  laceration along the radial aspect of this digit Deformity noted to left long finger No pain or tenderness to the thumb, fourth or fifth digits, no tenderness about the wrist Large laceration on the dorsal aspect of the hand, one smaller as well; significant tenderness on the dorsal aspect of the hand See photos  Lymphadenopathy:     Cervical: No cervical adenopathy.  Skin:    General: Skin is warm and dry.     Coloration: Skin is not pale.     Findings: No rash.  Neurological:     Mental Status: He is alert.  Coordination: Coordination normal.              ED Treatments / Results  Labs (all labs ordered are listed, but only abnormal results are displayed) Labs Reviewed  BASIC METABOLIC PANEL - Abnormal; Notable for the following components:      Result Value   Sodium 134 (*)    CO2 21 (*)    Glucose, Bld 144 (*)    BUN 30 (*)    Creatinine, Ser 1.45 (*)    GFR calc non Af Amer 51 (*)    GFR calc Af Amer 59 (*)    All other components within normal limits  CBC WITH DIFFERENTIAL/PLATELET - Abnormal; Notable for the following components:   RBC 3.81 (*)    Hemoglobin 12.0 (*)    HCT 35.3 (*)    Neutro Abs 8.0 (*)    All other components within normal limits  SARS CORONAVIRUS 2 BY RT PCR (HOSPITAL ORDER, [place] LAB)    EKG None  Radiology Dg Hand Complete Left  Result Date: 04/03/2019 CLINICAL DATA:  Left hand injury EXAM: LEFT HAND - COMPLETE 3+ VIEW COMPARISON:  None. FINDINGS: There is a transverse, distracted fracture of the proximal aspect left second proximal phalanx. There is an additional fracture component of the distal aspect of the left second proximal phalanx, with nonanatomic flexion of the digit about this fracture and open overlying laceration. The middle and distal phalanges are intact. There is an additional comminuted and displaced fracture of the proximal aspect proximal left third phalanx which clearly extends into  the metacarpophalangeal joint. Open lacerations overlying the dorsum of the left hand and proximal second and third digits. No other fracture or dislocation. Punctuate metallic foreign bodies noted in the soft tissues of the distal left third and proximal left fourth digits. IMPRESSION: 1. There is a transverse, distracted fracture of the proximal aspect left second proximal phalanx. There is an additional fracture component of the distal aspect of the left second proximal phalanx, with nonanatomic flexion of the digit about this fracture and open overlying laceration. The middle and distal phalanges are intact. 2. There is an additional comminuted and displaced fracture of the proximal aspect proximal left third phalanx which clearly extends into the metacarpophalangeal joint. 3. Open lacerations overlying the dorsum of the left hand and proximal second and third digits. No other fracture or dislocation. 4. Punctuate metallic foreign bodies noted in the soft tissues of the distal left third and proximal left fourth digits. Electronically Signed   By: Eddie Candle M.D.   On: 04/03/2019 10:34    Procedures Procedures (including critical care time)  Medications Ordered in ED Medications  0.9 %  sodium chloride infusion ( Intravenous Stopped 04/03/19 1109)  ceFAZolin (ANCEF) IVPB 1 g/50 mL premix ( Intravenous Stopped 04/03/19 1147)  fentaNYL (SUBLIMAZE) injection 100 mcg (100 mcg Intravenous Given 04/03/19 1015)  Tdap (BOOSTRIX) injection 0.5 mL (0.5 mLs Intramuscular Given 04/03/19 1016)  HYDROmorphone (DILAUDID) injection 1 mg (1 mg Intravenous Given 04/03/19 1123)     Initial Impression / Assessment and Plan / ED Course  I have reviewed the triage vital signs and the nursing notes.  Pertinent labs & imaging results that were available during my care of the patient were reviewed by me and considered in my medical decision making (see chart for details).        Patient presenting with left hand  injury after a large, steel tube fell on his  hand.  Tetanus updated in the ED.  Patient has multiple open fractures in his left index finger and a fracture in the left middle finger, as above.    I discussed patient case with Dr. Grandville Silos with hand surgery who requests ED to ED transfer to [facility] to be transported to the OR.  Accepting physician at [facility], ED is Dr. Regenia Skeeter. Dose of Ancef given prior to transfer. Covid-19 test is pending.  Patient's pain significant and unchanged after fentanyl, Dilaudid 1 mg given prior to transfer with good relief.  Wounds were wrapped loosely with saline moist gauze and gauze roll.  Patient will be transferred via POV by his wife.  He is advised to make no stops and do not eat or drink in route or before surgery.  He understands and agrees with plan.  Patient transferred in satisfactory condition. I discussed patient case with Dr. Laverta Baltimore who guided the patient's management and agrees with plan.  I appreciate Dr. Biagio Borg assistance with the patient.   Final Clinical Impressions(s) / ED Diagnoses   Final diagnoses:  Open fracture of left hand, initial encounter    ED Discharge Orders    None       Frederica Kuster, Hershal Coria 04/03/19 1159    LongWonda Olds, MD 04/03/19 1935

## 2019-04-03 NOTE — Anesthesia Preprocedure Evaluation (Signed)
Anesthesia Evaluation  Patient identified by MRN, date of birth, ID band Patient awake    Reviewed: Allergy & Precautions, NPO status , Patient's Chart, lab work & pertinent test results  Airway Mallampati: I  TM Distance: >3 FB Neck ROM: Full    Dental no notable dental hx.    Pulmonary former smoker,    Pulmonary exam normal breath sounds clear to auscultation       Cardiovascular hypertension, Pt. on medications +CHF  Normal cardiovascular exam+ dysrhythmias Atrial Fibrillation  Rhythm:Regular Rate:Normal  ECG: NSR, rate 70  ECHO: Left ventricle: The cavity size was normal. Wall thickness was normal. Systolic function was mildly reduced. The estimated ejection fraction was in the range of 45% to 50%. Diffuse hypokinesis. The study is not technically sufficient to allow evaluation of LV diastolic function. Left atrium: The atrium was normal in size. Right atrium: The atrium was mildly dilated. Tricuspid valve: There was trivial regurgitation. Pulmonary arteries: PA peak pressure: 26 mm Hg (S). Inferior vena cava: The vessel was dilated, but collapses >50%, suggesting an elevated RA pressure of 8 mmHg.   Neuro/Psych PSYCHIATRIC DISORDERS Hard of hearing    GI/Hepatic negative GI ROS, Neg liver ROS,   Endo/Other  Hypothyroidism   Renal/GU negative Renal ROS     Musculoskeletal   Abdominal   Peds  Hematology  (+) anemia ,   Anesthesia Other Findings Left hand trauma  Reproductive/Obstetrics                             Anesthesia Physical Anesthesia Plan  ASA: III and emergent  Anesthesia Plan: General   Post-op Pain Management:    Induction: Intravenous and Rapid sequence  PONV Risk Score and Plan: 2 and Ondansetron, Dexamethasone, Midazolam and Treatment may vary due to age or medical condition  Airway Management Planned: Oral ETT  Additional Equipment:   Intra-op Plan:    Post-operative Plan: Extubation in OR  Informed Consent: I have reviewed the patients History and Physical, chart, labs and discussed the procedure including the risks, benefits and alternatives for the proposed anesthesia with the patient or authorized representative who has indicated his/her understanding and acceptance.     Dental advisory given  Plan Discussed with: CRNA  Anesthesia Plan Comments:         Anesthesia Quick Evaluation                                  Anesthesia Evaluation    Reviewed: Allergy & Precautions, Patient's Chart, lab work & pertinent test results  Airway        Dental   Pulmonary neg pulmonary ROS, former smoker,           Cardiovascular hypertension, Pt. on medications +CHF  + dysrhythmias Atrial Fibrillation   ECG: rate 70. Normal sinus rhythm Rightward axis  ECHO: Left ventricle: The cavity size was normal. Wall thickness was normal. Systolic function was mildly reduced. The estimated ejection fraction was in the range of 45% to 50%. Diffuse hypokinesis. The study is not technically sufficient to allow evaluation of LV diastolic function. Left atrium: The atrium was normal in size. Right atrium: The atrium was mildly dilated. Tricuspid valve: There was trivial regurgitation. Pulmonary arteries: PA peak pressure: 26 mm Hg (S). Inferior vena cava: The vessel was dilated, but collapses >50%, suggesting an elevated RA pressure of 8 mmHg.  Neuro/Psych PSYCHIATRIC DISORDERS negative neurological ROS     GI/Hepatic negative GI ROS, Neg liver ROS,   Endo/Other  Hypothyroidism   Renal/GU negative Renal ROS     Musculoskeletal negative musculoskeletal ROS (+)   Abdominal   Peds  Hematology  (+) anemia ,   Anesthesia Other Findings Left hand trauma  Reproductive/Obstetrics                             Anesthesia Physical Anesthesia Plan Anesthesia Quick Evaluation

## 2019-04-05 ENCOUNTER — Telehealth (HOSPITAL_COMMUNITY): Payer: Self-pay | Admitting: *Deleted

## 2019-04-05 NOTE — Telephone Encounter (Signed)
Ouch! Meds are ok.

## 2019-04-05 NOTE — Telephone Encounter (Signed)
Pt called to report he had an accident and had to have surgery on his hand. He was prescribed a pain medication and antibiotics. PT requests that Dr.Bensimhon look over his medication list and make sure there arent any dangerous interactions with new meds. Also requests  Dr.Bensimhon look over notes from his surgery.   Routed to [place]

## 2019-04-05 NOTE — Telephone Encounter (Signed)
Pt aware.

## 2019-04-06 ENCOUNTER — Encounter (HOSPITAL_COMMUNITY): Payer: Self-pay | Admitting: Orthopedic Surgery

## 2019-04-06 NOTE — Anesthesia Postprocedure Evaluation (Signed)
Anesthesia Post Note  Patient: Theodore Weber  Procedure(s) Performed: OPEN REDUCTION INTERNAL FIXATION  LEFT HAND LONG AND INDEX FINGERS, REPAIR MULTIPLE HAND LACERATIONS (Left Hand) EXTENSOR TENDON REPAIR LONG AND INDEX FINGERS LEFT HAND (Hand)     Patient location during evaluation: PACU Anesthesia Type: General Level of consciousness: sedated Pain management: pain level controlled Vital Signs Assessment: post-procedure vital signs reviewed and stable Respiratory status: spontaneous breathing Cardiovascular status: stable Postop Assessment: no apparent nausea or vomiting Anesthetic complications: no    Last Vitals:  Vitals:   04/03/19 1715 04/03/19 1730  BP: 117/68 116/78  Pulse: 83 80  Resp: 16 (!) 9  Temp:  36.8 C  SpO2: 98% 98%    Last Pain:  Vitals:   04/03/19 1730  TempSrc:   PainSc: 0-No pain   Pain Goal:                   Huston Foley

## 2019-04-25 ENCOUNTER — Other Ambulatory Visit: Payer: Self-pay | Admitting: Orthopedic Surgery

## 2019-04-26 ENCOUNTER — Telehealth: Payer: Self-pay | Admitting: Physician Assistant

## 2019-04-26 NOTE — Telephone Encounter (Signed)
Agree. Ok to proceed. Recently tolerated knee replacement without difficulty.

## 2019-04-26 NOTE — Telephone Encounter (Signed)
Patient contacted after our answering service inquiring about his Eliquis prior to finger surgery next week.  He has a history of nonischemic cardiomyopathy related to atrial fibrillation.  Last EKG obtained in August showed he is in sinus rhythm on amiodarone.  EF later normalized on the last echocardiogram.  He denies any chest pain, worsening shortness of breath, lower extremity edema, orthopnea or PND.  His upcoming procedure is a low risk surgery.  According to patient, his blood pressure and heart rate has been normal recently.  Last blood pressure was 102/67, pulse 70.  He may hold Eliquis for 2 days prior to the surgery, I will forward to Dr. [name] to review and to see if there is any different recommendation

## 2019-04-28 ENCOUNTER — Other Ambulatory Visit (HOSPITAL_COMMUNITY)
Admission: RE | Admit: 2019-04-28 | Discharge: 2019-04-28 | Disposition: A | Discharge status: Home / Self Care | Payer: Managed Care, Other (non HMO) | Source: Ambulatory Visit | Attending: Orthopedic Surgery | Admitting: Orthopedic Surgery

## 2019-04-28 ENCOUNTER — Other Ambulatory Visit: Payer: Self-pay

## 2019-04-28 ENCOUNTER — Encounter (HOSPITAL_COMMUNITY): Payer: Self-pay | Admitting: *Deleted

## 2019-04-28 DIAGNOSIS — Z01812 Encounter for preprocedural laboratory examination: Secondary | ICD-10-CM | POA: Insufficient documentation

## 2019-04-28 DIAGNOSIS — Z20828 Contact with and (suspected) exposure to other viral communicable diseases: Secondary | ICD-10-CM | POA: Diagnosis not present

## 2019-04-28 LAB — SARS CORONAVIRUS 2 (TAT 6-24 HRS): SARS Coronavirus 2: NEGATIVE

## 2019-04-28 NOTE — Progress Notes (Signed)
Pt denies SOB and chest pain. Pt stated that he is under the care of Dr. Dannielle Burn, Cardiology and Dr. Townsend Roger, PCP. Pt denies having recent labs. Pt stated that his cardiologist instructed him to take his last dose of Eliquis tonight. Pt made aware to stop taking  vitamins, fish and Oregano Oil and herbal medications. Do not take any NSAIDs ie: Ibuprofen, Advil, Naproxen (Aleve), Motrin, BC and Goody Powder. Pt verbalized understanding of all pre-op instructions. See PA, Anesthesiology, note.

## 2019-04-28 NOTE — Progress Notes (Signed)
Anesthesia Chart Review: Same day workup  Follows with cardiology for hx of HTN, afib s/p DCCV, NICM (EF recovered to 45-50% per echo 12/19). Cath 2018 with normal coronaries. CM felt to be related to etoh and tachy-mediated.  Hx of CKD s/p R nephrectomy 03/2017. Last creatinine 1.45 on 04/03/19. Baseline ~ 1.5-1.6 based on review of labs.   Clearance per telephone encounter 04/26/19, "Patient contacted after our answering service inquiring about his Eliquis prior to finger surgery next week.  He has a history of nonischemic cardiomyopathy related to atrial fibrillation.  Last EKG obtained in August showed he is in sinus rhythm on amiodarone.  EF later normalized on the last echocardiogram.  He denies any chest pain, worsening shortness of breath, lower extremity edema, orthopnea or PND.  His upcoming procedure is a low risk surgery.  According to patient, his blood pressure and heart rate has been normal recently.  Last blood pressure was 102/67, pulse 70.  He may hold Eliquis for 2 days prior to the surgery, I will forward to Dr. [name] to review and to see if there is any different recommendation. " Dr. [name] responded, "Agree. Ok to proceed. Recently tolerated knee replacement without difficulty."  Will need DOS labs and eval.  EKG 01/02/19: Normal sinus rhythm. Rate 70. Rightward axis  TTE 05/10/18: - Left ventricle: The cavity size was normal. Wall thickness was   normal. Systolic function was mildly reduced. The estimated   ejection fraction was in the range of 45% to 50%. Diffuse   hypokinesis. The study is not technically sufficient to allow   evaluation of LV diastolic function. - Left atrium: The atrium was normal in size. - Right atrium: The atrium was mildly dilated. - Tricuspid valve: There was trivial regurgitation. - Pulmonary arteries: PA peak pressure: 26 mm Hg (S). - Inferior vena cava: The vessel was dilated, but collapses >50%,   suggesting an elevated RA pressure of  8 mmHg.  Impressions:  - Compared to a prior study in 2018, the LVEF is higher at 45-50%   with global hypokinesis.  Cath 01/14/17:  There is severe left ventricular systolic dysfunction.   Dilated nonischemic cardiomyopathy with a global ejection fraction of 10-15%.  Normal coronary arteries.  Normal right heart pressures without evidence for pulmonary hypertension.  RECOMMENDATION: Guideline directed medical therapy for severe LV dysfunction with probable plan for  ICD therapy.   Wynonia Musty [facility] Short Stay Center/Anesthesiology Phone [phone] 04/28/2019 12:54 PM

## 2019-04-30 NOTE — Anesthesia Preprocedure Evaluation (Addendum)
Anesthesia Evaluation  Patient identified by MRN, date of birth, ID band Patient awake    Reviewed: Allergy & Precautions, NPO status , Patient's Chart, lab work & pertinent test results  Airway Mallampati: I  TM Distance: >3 FB Neck ROM: Full    Dental no notable dental hx.    Pulmonary former smoker,    Pulmonary exam normal breath sounds clear to auscultation       Cardiovascular hypertension, Pt. on medications +CHF  Normal cardiovascular exam+ dysrhythmias Atrial Fibrillation  Rhythm:Regular Rate:Normal  ECG: NSR, rate 70  ECHO: Left ventricle: The cavity size was normal. Wall thickness was normal. Systolic function was mildly reduced. The estimated ejection fraction was in the range of 45% to 50%. Diffuse hypokinesis. The study is not technically sufficient to allow evaluation of LV diastolic function. Left atrium: The atrium was normal in size. Right atrium: The atrium was mildly dilated. Tricuspid valve: There was trivial regurgitation. Pulmonary arteries: PA peak pressure: 26 mm Hg (S). Inferior vena cava: The vessel was dilated, but collapses >50%, suggesting an elevated RA pressure of 8 mmHg.   Neuro/Psych PSYCHIATRIC DISORDERS Hard of hearing    GI/Hepatic negative GI ROS, Neg liver ROS,   Endo/Other  Hypothyroidism   Renal/GU negative Renal ROS     Musculoskeletal   Abdominal   Peds  Hematology  (+) anemia ,   Anesthesia Other Findings Left hand trauma  Reproductive/Obstetrics                             Anesthesia Physical  Anesthesia Plan  ASA: IV  Anesthesia Plan: Regional   Post-op Pain Management:    Induction:   PONV Risk Score and Plan: 2 and Ondansetron, Dexamethasone, Midazolam and Treatment may vary due to age or medical condition  Airway Management Planned: Natural Airway  Additional Equipment:   Intra-op Plan:   Post-operative Plan:   Informed  Consent: I have reviewed the patients History and Physical, chart, labs and discussed the procedure including the risks, benefits and alternatives for the proposed anesthesia with the patient or authorized representative who has indicated his/her understanding and acceptance.     Dental advisory given  Plan Discussed with: CRNA  Anesthesia Plan Comments: Karoline Caldwell note: Follows with cardiology for hx of HTN, afib s/p DCCV, NICM (EF recovered to 45-50% per echo 12/19). Cath 2018 with normal coronaries. CM felt to be related to etoh and tachy-mediated.  Hx of CKD s/p R nephrectomy 03/2017. Last creatinine 1.45 on 04/03/19. Baseline ~ 1.5-1.6 based on review of labs.   Clearance per telephone encounter 04/26/19, "Patient contacted after our answering service inquiring about his Eliquis prior to finger surgery next week.  He has a history of nonischemic cardiomyopathy related to atrial fibrillation.  Last EKG obtained in August showed he is in sinus rhythm on amiodarone.  EF later normalized on the last echocardiogram.  He denies any chest pain, worsening shortness of breath, lower extremity edema, orthopnea or PND.  His upcoming procedure is a low risk surgery.  According to patient, his blood pressure and heart rate has been normal recently.  Last blood pressure was 102/67, pulse 70.  He may hold Eliquis for 2 days prior to the surgery, I will forward to Dr. [name] to review and to see if there is any different recommendation. " Dr. [name] responded, "Agree. Ok to proceed. Recently tolerated knee replacement without difficulty."  Will need DOS labs and  eval.  EKG 01/02/19: Normal sinus rhythm. Rate 70. Rightward axis  TTE 05/10/18: - Left ventricle: The cavity size was normal. Wall thickness was   normal. Systolic function was mildly reduced. The estimated   ejection fraction was in the range of 45% to 50%. Diffuse   hypokinesis. The study is not technically sufficient to allow    evaluation of LV diastolic function. - Left atrium: The atrium was normal in size. - Right atrium: The atrium was mildly dilated. - Tricuspid valve: There was trivial regurgitation. - Pulmonary arteries: PA peak pressure: 26 mm Hg (S). - Inferior vena cava: The vessel was dilated, but collapses >50%,   suggesting an elevated RA pressure of 8 mmHg.  Impressions:  - Compared to a prior study in 2018, the LVEF is higher at 45-50%   with global hypokinesis.  Cath 01/14/17: There is severe left ventricular systolic dysfunction.   Dilated nonischemic cardiomyopathy with a global ejection fraction of 10-15%.  Normal coronary arteries.  Normal right heart pressures without evidence for pulmonary hypertension.  RECOMMENDATION: Guideline directed medical therapy for severe LV dysfunction with probable plan for  ICD therapy. )      Anesthesia Quick Evaluation                                  Anesthesia Evaluation    Reviewed: Allergy & Precautions, Patient's Chart, lab work & pertinent test results  Airway        Dental   Pulmonary neg pulmonary ROS, former smoker,           Cardiovascular hypertension, Pt. on medications +CHF  + dysrhythmias Atrial Fibrillation   ECG: rate 70. Normal sinus rhythm Rightward axis  ECHO: Left ventricle: The cavity size was normal. Wall thickness was normal. Systolic function was mildly reduced. The estimated ejection fraction was in the range of 45% to 50%. Diffuse hypokinesis. The study is not technically sufficient to allow evaluation of LV diastolic function. Left atrium: The atrium was normal in size. Right atrium: The atrium was mildly dilated. Tricuspid valve: There was trivial regurgitation. Pulmonary arteries: PA peak pressure: 26 mm Hg (S). Inferior vena cava: The vessel was dilated, but collapses >50%, suggesting an elevated RA pressure of 8 mmHg.   Neuro/Psych PSYCHIATRIC DISORDERS negative neurological ROS      GI/Hepatic negative GI ROS, Neg liver ROS,   Endo/Other  Hypothyroidism   Renal/GU negative Renal ROS     Musculoskeletal negative musculoskeletal ROS (+)   Abdominal   Peds  Hematology  (+) anemia ,   Anesthesia Other Findings Left hand trauma  Reproductive/Obstetrics                             Anesthesia Physical Anesthesia Plan Anesthesia Quick Evaluation

## 2019-05-01 ENCOUNTER — Ambulatory Visit (HOSPITAL_COMMUNITY): Payer: Worker's Compensation | Admitting: Physician Assistant

## 2019-05-01 ENCOUNTER — Other Ambulatory Visit (HOSPITAL_COMMUNITY): Payer: Managed Care, Other (non HMO)

## 2019-05-01 ENCOUNTER — Other Ambulatory Visit: Payer: Self-pay

## 2019-05-01 ENCOUNTER — Ambulatory Visit (HOSPITAL_COMMUNITY)
Admission: RE | Admit: 2019-05-01 | Discharge: 2019-05-01 | Disposition: A | Discharge status: Home / Self Care | Payer: Worker's Compensation | Attending: Orthopedic Surgery | Admitting: Orthopedic Surgery

## 2019-05-01 ENCOUNTER — Encounter (HOSPITAL_COMMUNITY)
Admission: RE | Disposition: A | Discharge status: Home / Self Care | Payer: Self-pay | Source: Home / Self Care | Attending: Orthopedic Surgery

## 2019-05-01 ENCOUNTER — Encounter (HOSPITAL_COMMUNITY): Payer: Self-pay

## 2019-05-01 DIAGNOSIS — I96 Gangrene, not elsewhere classified: Secondary | ICD-10-CM | POA: Insufficient documentation

## 2019-05-01 DIAGNOSIS — Z79899 Other long term (current) drug therapy: Secondary | ICD-10-CM | POA: Insufficient documentation

## 2019-05-01 DIAGNOSIS — Z8249 Family history of ischemic heart disease and other diseases of the circulatory system: Secondary | ICD-10-CM | POA: Insufficient documentation

## 2019-05-01 DIAGNOSIS — I420 Dilated cardiomyopathy: Secondary | ICD-10-CM | POA: Diagnosis not present

## 2019-05-01 DIAGNOSIS — I5023 Acute on chronic systolic (congestive) heart failure: Secondary | ICD-10-CM | POA: Insufficient documentation

## 2019-05-01 DIAGNOSIS — I4891 Unspecified atrial fibrillation: Secondary | ICD-10-CM | POA: Diagnosis not present

## 2019-05-01 DIAGNOSIS — Z905 Acquired absence of kidney: Secondary | ICD-10-CM | POA: Diagnosis not present

## 2019-05-01 DIAGNOSIS — I110 Hypertensive heart disease with heart failure: Secondary | ICD-10-CM | POA: Diagnosis not present

## 2019-05-01 DIAGNOSIS — Z7901 Long term (current) use of anticoagulants: Secondary | ICD-10-CM | POA: Diagnosis not present

## 2019-05-01 DIAGNOSIS — E039 Hypothyroidism, unspecified: Secondary | ICD-10-CM | POA: Diagnosis not present

## 2019-05-01 DIAGNOSIS — H9190 Unspecified hearing loss, unspecified ear: Secondary | ICD-10-CM | POA: Diagnosis not present

## 2019-05-01 DIAGNOSIS — D649 Anemia, unspecified: Secondary | ICD-10-CM | POA: Diagnosis not present

## 2019-05-01 DIAGNOSIS — M24642 Ankylosis, left hand: Secondary | ICD-10-CM | POA: Diagnosis not present

## 2019-05-01 DIAGNOSIS — Z87891 Personal history of nicotine dependence: Secondary | ICD-10-CM | POA: Insufficient documentation

## 2019-05-01 DIAGNOSIS — S6722XA Crushing injury of left hand, initial encounter: Secondary | ICD-10-CM | POA: Diagnosis present

## 2019-05-01 DIAGNOSIS — S67191A Crushing injury of left index finger, initial encounter: Secondary | ICD-10-CM | POA: Diagnosis not present

## 2019-05-01 DIAGNOSIS — W230XXA Caught, crushed, jammed, or pinched between moving objects, initial encounter: Secondary | ICD-10-CM | POA: Insufficient documentation

## 2019-05-01 HISTORY — PX: AMPUTATION: SHX166

## 2019-05-01 HISTORY — DX: Presence of spectacles and contact lenses: Z97.3

## 2019-05-01 LAB — CBC
HCT: 31.8 % — ABNORMAL LOW (ref 39.0–52.0)
Hemoglobin: 11 g/dL — ABNORMAL LOW (ref 13.0–17.0)
MCH: 32 pg (ref 26.0–34.0)
MCHC: 34.6 g/dL (ref 30.0–36.0)
MCV: 92.4 fL (ref 80.0–100.0)
Platelets: 316 10*3/uL (ref 150–400)
RBC: 3.44 MIL/uL — ABNORMAL LOW (ref 4.22–5.81)
RDW: 11.9 % (ref 11.5–15.5)
WBC: 5.7 10*3/uL (ref 4.0–10.5)
nRBC: 0 % (ref 0.0–0.2)

## 2019-05-01 LAB — PROTIME-INR
INR: 1 (ref 0.8–1.2)
Prothrombin Time: 12.9 seconds (ref 11.4–15.2)

## 2019-05-01 LAB — BASIC METABOLIC PANEL
Anion gap: 9 (ref 5–15)
BUN: 23 mg/dL (ref 8–23)
CO2: 27 mmol/L (ref 22–32)
Calcium: 9.3 mg/dL (ref 8.9–10.3)
Chloride: 95 mmol/L — ABNORMAL LOW (ref 98–111)
Creatinine, Ser: 1.49 mg/dL — ABNORMAL HIGH (ref 0.61–1.24)
GFR calc Af Amer: 57 mL/min — ABNORMAL LOW (ref >60)
GFR calc non Af Amer: 50 mL/min — ABNORMAL LOW (ref >60)
Glucose, Bld: 95 mg/dL (ref 70–99)
Potassium: 4.8 mmol/L (ref 3.5–5.1)
Sodium: 131 mmol/L — ABNORMAL LOW (ref 135–145)

## 2019-05-01 SURGERY — AMPUTATION, FOOT, RAY
Anesthesia: Regional | Laterality: Left

## 2019-05-01 MED ORDER — LIDOCAINE HCL 1 % IJ SOLN
INTRAMUSCULAR | Status: AC
  Filled 2019-05-01: qty 20

## 2019-05-01 MED ORDER — FENTANYL CITRATE (PF) 100 MCG/2ML IJ SOLN: 25.0000 ug | INTRAMUSCULAR | Status: DC | PRN

## 2019-05-01 MED ORDER — CHLORHEXIDINE GLUCONATE 4 % EX LIQD: 60.0000 mL | Freq: Once | CUTANEOUS | Status: DC

## 2019-05-01 MED ORDER — PHENYLEPHRINE HCL (PRESSORS) 10 MG/ML IV SOLN
INTRAVENOUS | Status: DC | PRN
  Administered 2019-05-01 (×2): 40 ug via INTRAVENOUS
  Administered 2019-05-01: 80 ug via INTRAVENOUS

## 2019-05-01 MED ORDER — FENTANYL CITRATE (PF) 100 MCG/2ML IJ SOLN
INTRAMUSCULAR | Status: AC
  Filled 2019-05-01: qty 2

## 2019-05-01 MED ORDER — 0.9 % SODIUM CHLORIDE (POUR BTL) OPTIME
TOPICAL | Status: DC | PRN
  Administered 2019-05-01: 1000 mL

## 2019-05-01 MED ORDER — PROPOFOL 10 MG/ML IV BOLUS
INTRAVENOUS | Status: AC
  Filled 2019-05-01: qty 20

## 2019-05-01 MED ORDER — PROPOFOL 10 MG/ML IV BOLUS
INTRAVENOUS | Status: DC | PRN
  Administered 2019-05-01: 20 mg via INTRAVENOUS
  Administered 2019-05-01: 30 mg via INTRAVENOUS

## 2019-05-01 MED ORDER — MIDAZOLAM HCL 2 MG/2ML IJ SOLN
INTRAMUSCULAR | Status: DC | PRN
  Administered 2019-05-01: 2 mg via INTRAVENOUS

## 2019-05-01 MED ORDER — CEFAZOLIN SODIUM-DEXTROSE 2-4 GM/100ML-% IV SOLN
2.0000 g | INTRAVENOUS | Status: AC
  Administered 2019-05-01: 2 g via INTRAVENOUS
  Filled 2019-05-01: qty 100

## 2019-05-01 MED ORDER — OXYCODONE HCL 5 MG PO TABS: 5.0000 mg | ORAL_TABLET | Freq: Four times a day (QID) | ORAL | 0 refills | Status: DC | PRN

## 2019-05-01 MED ORDER — BUPIVACAINE HCL (PF) 0.25 % IJ SOLN
INTRAMUSCULAR | Status: AC
  Filled 2019-05-01: qty 30

## 2019-05-01 MED ORDER — APIXABAN 5 MG PO TABS: ORAL_TABLET | ORAL | 3 refills | Status: DC

## 2019-05-01 MED ORDER — FENTANYL CITRATE (PF) 250 MCG/5ML IJ SOLN
INTRAMUSCULAR | Status: AC
  Filled 2019-05-01: qty 5

## 2019-05-01 MED ORDER — LACTATED RINGERS IV SOLN
INTRAVENOUS | Status: DC | PRN
  Administered 2019-05-01: 07:00:00 via INTRAVENOUS

## 2019-05-01 MED ORDER — LIDOCAINE 2% (20 MG/ML) 5 ML SYRINGE
INTRAMUSCULAR | Status: AC
  Filled 2019-05-01: qty 5

## 2019-05-01 MED ORDER — PROPOFOL 500 MG/50ML IV EMUL
INTRAVENOUS | Status: DC | PRN
  Administered 2019-05-01: 100 ug/kg/min via INTRAVENOUS

## 2019-05-01 MED ORDER — ACETAMINOPHEN 325 MG PO TABS: 650.0000 mg | ORAL_TABLET | Freq: Four times a day (QID) | ORAL | Status: DC

## 2019-05-01 MED ORDER — BUPIVACAINE HCL (PF) 0.5 % IJ SOLN
INTRAMUSCULAR | Status: DC | PRN
  Administered 2019-05-01: 40 mL via PERINEURAL

## 2019-05-01 MED ORDER — FENTANYL CITRATE (PF) 100 MCG/2ML IJ SOLN
INTRAMUSCULAR | Status: DC | PRN
  Administered 2019-05-01 (×2): 50 ug via INTRAVENOUS

## 2019-05-01 MED ORDER — ONDANSETRON HCL 4 MG/2ML IJ SOLN
INTRAMUSCULAR | Status: DC | PRN
  Administered 2019-05-01: 4 mg via INTRAVENOUS

## 2019-05-01 MED ORDER — MIDAZOLAM HCL 2 MG/2ML IJ SOLN
INTRAMUSCULAR | Status: AC
  Filled 2019-05-01: qty 2

## 2019-05-01 MED ORDER — LIDOCAINE-EPINEPHRINE 1 %-1:100000 IJ SOLN
INTRAMUSCULAR | Status: AC
  Filled 2019-05-01: qty 1

## 2019-05-01 MED ORDER — LIDOCAINE HCL (CARDIAC) PF 100 MG/5ML IV SOSY
PREFILLED_SYRINGE | INTRAVENOUS | Status: DC | PRN
  Administered 2019-05-01: 100 mg via INTRAVENOUS

## 2019-05-01 MED ORDER — MEPERIDINE HCL 25 MG/ML IJ SOLN: 6.2500 mg | INTRAMUSCULAR | Status: DC | PRN

## 2019-05-01 MED ORDER — PROMETHAZINE HCL 25 MG/ML IJ SOLN: 6.2500 mg | INTRAMUSCULAR | Status: DC | PRN

## 2019-05-01 SURGICAL SUPPLY — 42 items
BLADE AVERAGE 25X9 (BLADE) ×2 IMPLANT
BNDG COHESIVE 2X5 TAN STRL LF (GAUZE/BANDAGES/DRESSINGS) IMPLANT
BNDG COHESIVE 3X5 TAN STRL LF (GAUZE/BANDAGES/DRESSINGS) ×2 IMPLANT
BNDG COHESIVE 4X5 TAN STRL (GAUZE/BANDAGES/DRESSINGS) IMPLANT
BNDG CONFORM 2 STRL LF (GAUZE/BANDAGES/DRESSINGS) IMPLANT
BNDG CONFORM 3 STRL LF (GAUZE/BANDAGES/DRESSINGS) ×2 IMPLANT
BNDG ELASTIC 2X5.8 VLCR STR LF (GAUZE/BANDAGES/DRESSINGS) IMPLANT
BNDG ELASTIC 3X5.8 VLCR STR LF (GAUZE/BANDAGES/DRESSINGS) ×2 IMPLANT
BNDG ESMARK 4X9 LF (GAUZE/BANDAGES/DRESSINGS) ×2 IMPLANT
BNDG GAUZE ELAST 4 BULKY (GAUZE/BANDAGES/DRESSINGS) ×2 IMPLANT
CHLORAPREP W/TINT 26 (MISCELLANEOUS) ×2 IMPLANT
CORD BIPOLAR FORCEPS 12FT (ELECTRODE) ×2 IMPLANT
COVER SURGICAL LIGHT HANDLE (MISCELLANEOUS) ×2 IMPLANT
COVER WAND RF STERILE (DRAPES) ×2 IMPLANT
CUFF TOURN SGL QUICK 18X4 (TOURNIQUET CUFF) ×2 IMPLANT
DRAPE SURG 17X23 STRL (DRAPES) ×4 IMPLANT
DRSG EMULSION OIL 3X3 NADH (GAUZE/BANDAGES/DRESSINGS) ×2 IMPLANT
DRSG XEROFORM 1X8 (GAUZE/BANDAGES/DRESSINGS) ×2 IMPLANT
ELECT REM PT RETURN 9FT ADLT (ELECTROSURGICAL) ×2
ELECTRODE REM PT RTRN 9FT ADLT (ELECTROSURGICAL) ×1 IMPLANT
GAUZE SPONGE 4X4 12PLY STRL (GAUZE/BANDAGES/DRESSINGS) ×2 IMPLANT
GAUZE SPONGE 4X4 12PLY STRL LF (GAUZE/BANDAGES/DRESSINGS) ×2 IMPLANT
GAUZE XEROFORM 1X8 LF (GAUZE/BANDAGES/DRESSINGS) ×2 IMPLANT
GLOVE BIO SURGEON STRL SZ7.5 (GLOVE) ×2 IMPLANT
GLOVE BIOGEL PI IND STRL 8 (GLOVE) ×1 IMPLANT
GLOVE BIOGEL PI INDICATOR 8 (GLOVE) ×1
GOWN STRL REUS W/ TWL XL LVL3 (GOWN DISPOSABLE) ×1 IMPLANT
GOWN STRL REUS W/TWL XL LVL3 (GOWN DISPOSABLE) ×1
KIT BASIN OR (CUSTOM PROCEDURE TRAY) ×2 IMPLANT
NEEDLE HYPO 25X1 1.5 SAFETY (NEEDLE) IMPLANT
PACK ORTHO EXTREMITY (CUSTOM PROCEDURE TRAY) ×2 IMPLANT
PAD CAST 4YDX4 CTTN HI CHSV (CAST SUPPLIES) ×1 IMPLANT
PADDING CAST ABS 4INX4YD NS (CAST SUPPLIES) ×1
PADDING CAST ABS COTTON 4X4 ST (CAST SUPPLIES) ×1 IMPLANT
PADDING CAST COTTON 4X4 STRL (CAST SUPPLIES) ×1
SUT ETHILON 4 0 PS 2 18 (SUTURE) ×2 IMPLANT
SUT MNCRL AB 4-0 PS2 18 (SUTURE) IMPLANT
SUT VIC AB 3-0 FS2 27 (SUTURE) ×2 IMPLANT
SUT VICRYL RAPIDE 4/0 PS 2 (SUTURE) ×2 IMPLANT
SYR 10ML LL (SYRINGE) IMPLANT
TOWEL GREEN STERILE FF (TOWEL DISPOSABLE) ×2 IMPLANT
UNDERPAD 30X30 (UNDERPADS AND DIAPERS) ×2 IMPLANT

## 2019-05-01 NOTE — Interval H&P Note (Signed)
History and Physical Interval Note:  05/01/2019 7:30 AM  Theodore Weber  has presented today for surgery, with the diagnosis of LEFT INDEX RAY AMPUTATION.  He sustained a crushing injury to the left hand about 2 weeks ago, and underwent initial attempt at digital salvage of the IF and LF.  Large portions of the IF has become increasingly necrotic, with exposed flexor tendon. The various methods of treatment have been discussed with the patient. After consideration of risks, benefits and other options for treatment, the patient has consented to  Procedure(s) with comments: LEFT INDEX RAY AMPUTATION (Left) - PRE OP BLOCK SURGERY REQUEST TIME: 53 MIN as a surgical intervention.  The patient's history has been reviewed, patient examined, no change in status, stable for surgery.  I have reviewed the patient's chart and labs.  Questions were answered to the patient's satisfaction.     [name]

## 2019-05-01 NOTE — Anesthesia Procedure Notes (Addendum)
Anesthesia Regional Block: Axillary brachial plexus block   Pre-Anesthetic Checklist: ,, timeout performed, Correct Patient, Correct Site, Correct Laterality, Correct Procedure, Correct Position, site marked, Risks and benefits discussed,  Surgical consent,  Pre-op evaluation,  At surgeon's request and post-op pain management  Laterality: Left  Prep: chloraprep       Needles:  Injection technique: Single-shot  Needle Type: Stimiplex          Additional Needles:   Procedures:,,,, ultrasound used (permanent image in chart),,,,  Narrative:  Start time: 05/01/2019 7:25 AM End time: 05/01/2019 7:32 AM Injection made incrementally with aspirations every 5 mL.  Performed by: Personally  Anesthesiologist: Rod Mae, MD  Additional Notes: Risks, benefits and alternative to block explained extensively. Good perineural spread. Patient tolerated procedure well, without complications.

## 2019-05-01 NOTE — Discharge Instructions (Addendum)
Discharge Instructions   You have a light dressing on your hand.  You may begin gentle motion of your fingers and hand immediately, but you should not do any heavy lifting or gripping.  Elevate your hand to reduce pain & swelling of the digits.  Ice over the operative site may be helpful to reduce pain & swelling.  DO NOT USE HEAT.  Take the tylenol REGULARLY--add oxycodone sparingly ONLY for pain unrelieved by the tylenol DO NOT RESTART [place] the dressing in place until the third day after your surgery and then remove it, leaving it open to air.  After the bandage has been removed you may shower, regularly washing the incision and letting the water run over it, but not submerging it (no swimming, soaking it in dishwater, etc.) You may drive a car when you are off of prescription pain medications and can safely control your vehicle with both hands.  You may have already made your follow-up appointment when we completed your preop visit.  If not, please call our office today or the next business day to make your return appointment for 10-15 days after surgery.   Please call [phone] during normal business hours or [phone] after hours for any problems. Including the following:  - excessive redness of the incisions - drainage for more than 4 days - fever of more than 101.5 F  *Please note that pain medications will not be refilled after hours or on weekends. Work status:

## 2019-05-01 NOTE — Op Note (Addendum)
05/01/2019  7:32 AM  PATIENT:  Theodore Weber  63 y.o. male  PRE-OPERATIVE DIAGNOSIS:  L IF crush injury with progressive necrosis & LF stiffness  POST-OPERATIVE DIAGNOSIS:  Same  PROCEDURE:  1. L IF amputation through the 2nd metacarpal    2. L LF PIP manipulation under anesthesia  SURGEON: Rayvon Char. Grandville Silos, MD  PHYSICIAN ASSISTANT: Morley Kos, OPA-C  ANESTHESIA:  regional and MAC  SPECIMENS:  IF ray to pathology for catalog  DRAINS:   None  EBL:  less than 50 mL  PREOPERATIVE INDICATIONS:  Theodore Weber is a  63 y.o. male with progressive necrosis of the L IF following crush injury  The risks benefits and alternatives were discussed with the patient preoperatively including but not limited to the risks of infection, bleeding, nerve injury, cardiopulmonary complications, the need for revision surgery, among others, and the patient verbalized understanding and consented to proceed.  OPERATIVE IMPLANTS: none  OPERATIVE PROCEDURE:  After receiving prophylactic antibiotics and a regional block, the patient was escorted to the operative theatre and placed in a supine position.  A surgical "time-out" was performed during which the planned procedure, proposed operative site, and the correct patient identity were compared to the operative consent and agreement confirmed by the circulating nurse according to current facility policy.  Following application of a tourniquet to the operative extremity, the exposed skin was prescrubbed with a hibiclens scrub brush before being formally prepped with Chloraprep and draped in the usual sterile fashion.  The limb was exsanguinated with an Esmarch bandage and the tourniquet inflated to approximately 136mHg higher than systolic BP.  A circumferential incision was marked at the level of the proximal phalanx midpoint of the index finger, and extended as a racquet shaped incision with a proximal extension dorsally over the second metacarpal.   Distally, the incision was made with respect to the viable skin flaps.  Full-thickness flaps were elevated.  The extensor tendons were divided more distally than normally, and tucked to overlie the third metacarpal to be used as additional later sources for graft if need be.  The dorsal periosteum was incised over the metacarpal and the metacarpal was divided with a saw obliquely at the proximal diametaphyseal junction.  The second metacarpal was then circumferentially dissected free distally.  The interosseous muscles were divided both radially and ulnarly.  The skeletal elements were further dissected free from the overlying skin flaps and the specimen passed off.  The radial and ulnar digital nerves were divided very distally, suture ligated, and folded back into the muscle layer for the metacarpal head once lain.  They were secured in place to prevent migration.  The wound was copiously irrigated.  The periosteal tube was then reapproximated with running 3-0 Vicryl suture.  This was done after the wound was first copiously irrigated.  Tourniquet was deflated, and the skin closure commenced working from proximal to distal with running 4-0 Vicryl Rapide horizontal mattress sutures.  Distally, the skin was trimmed as could be done best with the available viable skin as the trauma had presented.  This resulted in a slight distal dogear, which should settle down over time.  A bulky dressing was applied.  The long finger was manipulated at the PIP level.  The proximal phalanx was stabilized during this, and some additional motion was gained at the PIP, in addition to a freeing of the flexor tendons from early adhesions.  He was taken to the recovery room in stable condition.  DISPOSITION:  He will be discharged home today with typical instructions, continuing formal therapy, returning to the office in 10 to 15 days.

## 2019-05-01 NOTE — Transfer of Care (Signed)
Immediate Anesthesia Transfer of Care Note  Patient: Vanessa Marion  Procedure(s) Performed: LEFT INDEX RAY AMPUTATION (Left )  Patient Location: PACU  Anesthesia Type:MAC combined with regional for post-op pain  Level of Consciousness: awake, alert  and oriented  Airway & Oxygen Therapy: Patient Spontanous Breathing and Patient connected to nasal cannula oxygen  Post-op Assessment: Report given to RN, Post -op Vital signs reviewed and stable and Patient moving all extremities  Post vital signs: Reviewed and stable  Last Vitals:  Vitals Value Taken Time  BP 110/76 05/01/19 0905  Temp    Pulse 61 05/01/19 0910  Resp 15 05/01/19 0910  SpO2 99 % 05/01/19 0910  Vitals shown include unvalidated device data.  Last Pain:  Vitals:   05/01/19 0641  PainSc: 0-No pain         Complications: No apparent anesthesia complications

## 2019-05-02 ENCOUNTER — Encounter (HOSPITAL_COMMUNITY): Payer: Self-pay | Admitting: Orthopedic Surgery

## 2019-05-02 LAB — SURGICAL PATHOLOGY

## 2019-05-03 ENCOUNTER — Encounter (HOSPITAL_COMMUNITY): Payer: Self-pay | Admitting: Orthopedic Surgery

## 2019-05-03 NOTE — Anesthesia Postprocedure Evaluation (Signed)
Anesthesia Post Note  Patient: Theodore Weber  Procedure(s) Performed: LEFT INDEX RAY AMPUTATION (Left )     Patient location during evaluation: PACU Anesthesia Type: Regional Level of consciousness: awake and alert Pain management: pain level controlled Vital Signs Assessment: post-procedure vital signs reviewed and stable Respiratory status: spontaneous breathing Cardiovascular status: stable Anesthetic complications: no    Last Vitals:  Vitals:   05/01/19 0935 05/01/19 0950  BP:  117/63  Pulse:  (!) 59  Resp:  10  Temp: (!) 36.1 C   SpO2:  100%    Last Pain:  Vitals:   05/01/19 0935  PainSc: 0-No pain                 Nolon Nations

## 2019-05-04 ENCOUNTER — Telehealth (HOSPITAL_COMMUNITY): Payer: Self-pay | Admitting: *Deleted

## 2019-05-04 NOTE — Telephone Encounter (Signed)
Received fax from [facility], pt is sch for L index amputation and they needs clearance.  Per Dr [name]:  Optimized for surgery from cardiac standpoint.  Hold Eliquis 2 days prior to surgery.  Note faxed back to them at [phone] atten: Dan Europe

## 2019-06-30 ENCOUNTER — Other Ambulatory Visit (HOSPITAL_COMMUNITY): Payer: Self-pay

## 2019-06-30 MED ORDER — APIXABAN 5 MG PO TABS: ORAL_TABLET | ORAL | 3 refills | Status: DC

## 2019-07-10 ENCOUNTER — Other Ambulatory Visit (HOSPITAL_COMMUNITY): Payer: Self-pay

## 2019-07-10 ENCOUNTER — Other Ambulatory Visit (HOSPITAL_COMMUNITY): Payer: Self-pay | Admitting: Cardiology

## 2019-07-10 MED ORDER — LOSARTAN POTASSIUM 25 MG PO TABS: 25.0000 mg | ORAL_TABLET | Freq: Every day | ORAL | 3 refills | Status: DC

## 2019-07-10 NOTE — Telephone Encounter (Signed)
Opened in Error.

## 2019-08-17 ENCOUNTER — Telehealth (HOSPITAL_COMMUNITY): Payer: Self-pay | Admitting: *Deleted

## 2019-08-17 ENCOUNTER — Telehealth (HOSPITAL_COMMUNITY): Payer: Self-pay

## 2019-08-17 NOTE — Telephone Encounter (Signed)
Called pt to advise Dr.Bensimhon wanted him to wear a 7 day zio patch. Per Dr.Bensimhon pt does not need to come to clinic to have zio placed we can mail one to patients home.  Pt did not answer/left VM for pt to return my call.

## 2019-08-17 NOTE — Telephone Encounter (Signed)
Entered in error,

## 2019-08-17 NOTE — Telephone Encounter (Signed)
Pt left message on vm asking for return call regarding irregular heart rhythm.  Called patient back.   Pt reports he wakes up many mornings with irregular heart rhythms as reported from home monitoring device.  Its not every day but it happens a lot.  Pt denies any symptoms of chest pain, sob or dizziness.  Office staff Presenter, broadcasting to speak with MD to decide if patient should come in for EKG and zio patch.  Advised patient he will get a call to discuss plan.

## 2019-08-17 NOTE — Telephone Encounter (Signed)
Advised patient of Jasmines note. Pt reports he feels fine.  Advised he can continue his daily routine and call office if he feels bad.

## 2019-08-23 ENCOUNTER — Ambulatory Visit (HOSPITAL_COMMUNITY)
Admission: RE | Admit: 2019-08-23 | Discharge: 2019-08-23 | Disposition: A | Discharge status: Home / Self Care | Payer: Managed Care, Other (non HMO) | Source: Ambulatory Visit | Attending: Internal Medicine | Admitting: Internal Medicine

## 2019-08-23 ENCOUNTER — Other Ambulatory Visit: Payer: Self-pay

## 2019-08-23 DIAGNOSIS — I493 Ventricular premature depolarization: Secondary | ICD-10-CM

## 2019-09-12 NOTE — Addendum Note (Signed)
Encounter addended by: Micki Riley, RN on: 09/12/2019 2:06 PM  Actions taken: Imaging Exam ended

## 2019-10-26 ENCOUNTER — Other Ambulatory Visit: Payer: Self-pay | Admitting: Pharmacist

## 2019-10-26 MED ORDER — APIXABAN 5 MG PO TABS: ORAL_TABLET | ORAL | 5 refills | Status: DC

## 2019-10-26 NOTE — Progress Notes (Signed)
Age 64, weight 85kg, SCr 1.49 on 05/01/19, afib indication, last OV Aug 2020

## 2019-11-06 ENCOUNTER — Ambulatory Visit (HOSPITAL_COMMUNITY)
Admission: RE | Admit: 2019-11-06 | Discharge: 2019-11-06 | Disposition: A | Discharge status: Home / Self Care | Payer: 59 | Source: Ambulatory Visit | Attending: Internal Medicine | Admitting: Internal Medicine

## 2019-11-06 ENCOUNTER — Other Ambulatory Visit: Payer: Self-pay

## 2019-11-06 ENCOUNTER — Ambulatory Visit (HOSPITAL_BASED_OUTPATIENT_CLINIC_OR_DEPARTMENT_OTHER)
Admission: RE | Admit: 2019-11-06 | Discharge: 2019-11-06 | Disposition: A | Discharge status: Home / Self Care | Payer: 59 | Source: Ambulatory Visit | Attending: Internal Medicine | Admitting: Internal Medicine

## 2019-11-06 ENCOUNTER — Encounter (HOSPITAL_COMMUNITY): Payer: Self-pay | Admitting: Internal Medicine

## 2019-11-06 VITALS — BP 124/80 | HR 74 | Wt 200.8 lb

## 2019-11-06 DIAGNOSIS — I428 Other cardiomyopathies: Secondary | ICD-10-CM | POA: Diagnosis not present

## 2019-11-06 DIAGNOSIS — Z7901 Long term (current) use of anticoagulants: Secondary | ICD-10-CM | POA: Insufficient documentation

## 2019-11-06 DIAGNOSIS — Z8249 Family history of ischemic heart disease and other diseases of the circulatory system: Secondary | ICD-10-CM | POA: Diagnosis not present

## 2019-11-06 DIAGNOSIS — I480 Paroxysmal atrial fibrillation: Secondary | ICD-10-CM | POA: Diagnosis not present

## 2019-11-06 DIAGNOSIS — N189 Chronic kidney disease, unspecified: Secondary | ICD-10-CM | POA: Diagnosis not present

## 2019-11-06 DIAGNOSIS — R008 Other abnormalities of heart beat: Secondary | ICD-10-CM | POA: Diagnosis not present

## 2019-11-06 DIAGNOSIS — Z79899 Other long term (current) drug therapy: Secondary | ICD-10-CM | POA: Diagnosis not present

## 2019-11-06 DIAGNOSIS — I472 Ventricular tachycardia: Secondary | ICD-10-CM | POA: Insufficient documentation

## 2019-11-06 DIAGNOSIS — I130 Hypertensive heart and chronic kidney disease with heart failure and stage 1 through stage 4 chronic kidney disease, or unspecified chronic kidney disease: Secondary | ICD-10-CM | POA: Diagnosis not present

## 2019-11-06 DIAGNOSIS — N2889 Other specified disorders of kidney and ureter: Secondary | ICD-10-CM | POA: Diagnosis not present

## 2019-11-06 DIAGNOSIS — I5022 Chronic systolic (congestive) heart failure: Secondary | ICD-10-CM | POA: Insufficient documentation

## 2019-11-06 DIAGNOSIS — F1010 Alcohol abuse, uncomplicated: Secondary | ICD-10-CM

## 2019-11-06 DIAGNOSIS — I4891 Unspecified atrial fibrillation: Secondary | ICD-10-CM | POA: Insufficient documentation

## 2019-11-06 DIAGNOSIS — Z87891 Personal history of nicotine dependence: Secondary | ICD-10-CM | POA: Diagnosis not present

## 2019-11-06 DIAGNOSIS — I471 Supraventricular tachycardia: Secondary | ICD-10-CM | POA: Insufficient documentation

## 2019-11-06 DIAGNOSIS — I493 Ventricular premature depolarization: Secondary | ICD-10-CM | POA: Diagnosis not present

## 2019-11-06 LAB — COMPREHENSIVE METABOLIC PANEL
ALT: 15 U/L (ref 0–44)
AST: 21 U/L (ref 15–41)
Albumin: 4.4 g/dL (ref 3.5–5.0)
Alkaline Phosphatase: 45 U/L (ref 38–126)
Anion gap: 11 (ref 5–15)
BUN: 24 mg/dL — ABNORMAL HIGH (ref 8–23)
CO2: 22 mmol/L (ref 22–32)
Calcium: 9.1 mg/dL (ref 8.9–10.3)
Chloride: 95 mmol/L — ABNORMAL LOW (ref 98–111)
Creatinine, Ser: 1.61 mg/dL — ABNORMAL HIGH (ref 0.61–1.24)
GFR calc Af Amer: 52 mL/min — ABNORMAL LOW (ref >60)
GFR calc non Af Amer: 45 mL/min — ABNORMAL LOW (ref >60)
Glucose, Bld: 117 mg/dL — ABNORMAL HIGH (ref 70–99)
Potassium: 4.2 mmol/L (ref 3.5–5.1)
Sodium: 128 mmol/L — ABNORMAL LOW (ref 135–145)
Total Bilirubin: 1.1 mg/dL (ref 0.3–1.2)
Total Protein: 7.3 g/dL (ref 6.5–8.1)

## 2019-11-06 LAB — CBC
HCT: 36.4 % — ABNORMAL LOW (ref 39.0–52.0)
Hemoglobin: 12.4 g/dL — ABNORMAL LOW (ref 13.0–17.0)
MCH: 31.6 pg (ref 26.0–34.0)
MCHC: 34.1 g/dL (ref 30.0–36.0)
MCV: 92.6 fL (ref 80.0–100.0)
Platelets: 329 10*3/uL (ref 150–400)
RBC: 3.93 MIL/uL — ABNORMAL LOW (ref 4.22–5.81)
RDW: 12.1 % (ref 11.5–15.5)
WBC: 6.8 10*3/uL (ref 4.0–10.5)
nRBC: 0 % (ref 0.0–0.2)

## 2019-11-06 LAB — BRAIN NATRIURETIC PEPTIDE: B Natriuretic Peptide: 82.8 pg/mL (ref 0.0–100.0)

## 2019-11-06 NOTE — Patient Instructions (Signed)
Labs done today. We will contact you only if your labs are abnormal.  No medication changes were made. Please continue all current medications as prescribed.  Your physician recommends that you schedule a follow-up appointment in: 9 months. Please contact our office in February 2022 for a March 2022 appointment.  If you have any questions or concerns before your next appointment please send [place] a message through [place] or call our office at [phone].    TO LEAVE A MESSAGE FOR THE NURSE SELECT OPTION 2, PLEASE LEAVE A MESSAGE INCLUDING: . YOUR NAME . DATE OF BIRTH . CALL BACK NUMBER . REASON FOR CALL**this is important as we prioritize the call backs  [place] AS LONG AS YOU CALL BEFORE 4:00 PM   At the [facility], you and your health needs are our priority. As part of our continuing mission to provide you with exceptional heart care, we have created designated Provider Care Teams. These Care Teams include your primary Cardiologist (physician) and Advanced Practice Providers (APPs- Physician Assistants and Nurse Practitioners) who all work together to provide you with the care you need, when you need it.   You may see any of the following providers on your designated Care Team at your next follow up: Marland Kitchen Dr Glori Bickers . Dr Loralie Champagne . Darrick Grinder, NP . Lyda Jester, PA . Audry Riles, PharmD   Please be sure to bring in all your medications bottles to every appointment.

## 2019-11-06 NOTE — Progress Notes (Signed)
Advanced Heart Failure Clinic Note   Referring Physician: Dr. Gwenlyn Found Primary Care: No PCP Primary Cardiologist: Dr. Avelina Laine. Theodore Weber   HPI: Theodore Weber is a 64 y.o. male with past medical history of R renal mass, HTN, and ETOH abuse and chronic systolic CHF (EF 27-74%). S/p R nephrectomy 03/2017.   He was admitted 01/13/17-01/16/17 to [facility] as a transfer from [facility] with a several day history of flank pain. CT and US revealed R renal mass concerning for malignancy. Plan was for an elective R nephrectomy. On presentation to the OR he was in Afib RVR, so case was cancelled. He was started on cardizem and heparin gtts. Underwent nuclear stress test that showed no ischemia, severe LV HK with EF estimated 12%. Echo with EF 20-25%. He underwent TEE/DCCV, with return to NSR. He was started on Eliquis and amiodarone. He was seen by EP who recommended medical therapy pending cancer prognosis prior to considering ICD. Surgery for R nephrectomy is schedule for Dec. 2018. He was seen by the CHF team and started on Digoxin, losartan and Spiro. Noted to have very heavy ETOH use. Discharge weight 183 pounds.   Recently admitted 10/15 - 03/17/17 for Right Radical Nephrectomy 03/15/17  Echo 12/19 EF 50-55%  He presents today for HF follow up. In November 2020 had a crush injury to his left hand. Eventually lost his left index finger. Remains in PT/OT for it. From cardiac standpoint doing well. Walking several miles per day. Doing 6-mile trail hikes. No CP or SOB. Taking BP every day SBP 100-110/70.  Had some palpitations in April. Zio patch placed.   Zio 09/12/19  1. Sinus rhythm - avg HR of 76 2. 51 runs of brief SVT occurred, the run with the fastestinterval lasting 6 beats with a max rate of 207 bpm, the longest lasting 14 beats with an avg rate of 105 bpm. 3. Isolated PACs were occasional (1.7%,14998) 4. Isolated PVCs were occasional (3.1%, 12878) 5.  Ventricular bigeminy and rrigeminy were  present. 6. Four patient-triggered events noted 3 associated with sinus rhythm. One associated with isolated PVC 7. No AF noted  Echo today 11/06/19 EF 50% Personally reviewed   ECHO 04/06/2017 EF 40-45% Grade II DD Bedside Echo 02/17/17 EF 30-35% Echo 05/10/18: EF 50-55%  Past Medical History:  Diagnosis Date  . Atrial fibrillation ([place]) 12/2016  . Cancer ([place])    kidney  . Dysrhythmia   . HOH (hard of hearing)   . Hypertension   . Renal mass 12/2016  . Systolic dysfunction with acute on chronic heart failure ([place]) 01/15/2017  . Wears glasses    Current Outpatient Medications  Medication Sig Dispense Refill  . acetaminophen (TYLENOL) 325 MG tablet Take 2 tablets (650 mg total) by mouth every 6 (six) hours.    Marland Kitchen amiodarone (PACERONE) 200 MG tablet Take 100 mg by mouth daily.    Marland Kitchen apixaban (ELIQUIS) 5 MG TABS tablet TAKE 1 TABLET(5 MG) BY MOUTH TWICE DAILY 60 tablet 5  . furosemide (LASIX) 40 MG tablet Take 1 tablet (40 mg total) by mouth daily as needed (For weight gain 3 lbs or more overnight, 5 lbs or more in 1 week). 30 tablet 6  . levothyroxine (SYNTHROID, LEVOTHROID) 75 MCG tablet Take 75 mcg by mouth daily before breakfast.    . losartan (COZAAR) 25 MG tablet Take 1 tablet (25 mg total) by mouth daily. 90 tablet 3  . OIL OF OREGANO PO Take 1 each by mouth See admin  instructions. Take 1 dropper full by mouth daily    . spironolactone (ALDACTONE) 25 MG tablet Take 0.5 tablets (12.5 mg total) by mouth at bedtime. 15 tablet 11   No current facility-administered medications for this encounter.   No Known Allergies  Social History   Socioeconomic History  . Marital status: Married    Spouse name: Not on file  . Number of children: Not on file  . Years of education: Not on file  . Highest education level: Not on file  Occupational History  . Not on file  Tobacco Use  . Smoking status: Former Smoker    Types: Cigars    Quit date: 01/09/2017    Years since quitting: 2.8   . Smokeless tobacco: Current User    Types: Snuff  Substance and Sexual Activity  . Alcohol use: Yes    Alcohol/week: 3.0 standard drinks    Types: 3 Cans of beer per week    Comment: Occasionally  . Drug use: No  . Sexual activity: Not on file  Other Topics Concern  . Not on file  Social History Narrative  . Not on file   Social Determinants of Health   Financial Resource Strain:   . Difficulty of Paying Living Expenses:   Food Insecurity:   . Worried About Charity fundraiser in the Last Year:   . Arboriculturist in the Last Year:   Transportation Needs:   . Film/video editor (Medical):   Marland Kitchen Lack of Transportation (Non-Medical):   Physical Activity:   . Days of Exercise per Week:   . Minutes of Exercise per Session:   Stress:   . Feeling of Stress :   Social Connections:   . Frequency of Communication with Friends and Family:   . Frequency of Social Gatherings with Friends and Family:   . Attends Religious Services:   . Active Member of Clubs or Organizations:   . Attends Archivist Meetings:   Marland Kitchen Marital Status:   Intimate Partner Violence:   . Fear of Current or Ex-Partner:   . Emotionally Abused:   Marland Kitchen Physically Abused:   . Sexually Abused:    Family History  Problem Relation Age of Onset  . Hypertension Father    Vitals:   11/06/19 0942  BP: 124/80  Pulse: 74  SpO2: 99%  Weight: 91.1 kg (200 lb 12.8 oz)   Wt Readings from Last 3 Encounters:  11/06/19 91.1 kg (200 lb 12.8 oz)  05/01/19 85.3 kg (188 lb)  04/03/19 83 kg (183 lb)   PHYSICAL EXAM: General:  Well appearing. No resp difficulty HEENT: normal Neck: supple. no JVD. Carotids 2+ bilat; no bruits. No lymphadenopathy or thryomegaly appreciated. Cor: PMI nondisplaced. Regular rate & rhythm. No rubs, gallops or murmurs. Lungs: clear Abdomen: soft, nontender, nondistended. No hepatosplenomegaly. No bruits or masses. Good bowel sounds. Extremities: no cyanosis, clubbing, rash, edema  L hand missing index finger. + swelling Neuro: alert & orientedx3, cranial nerves grossly intact. moves all 4 extremities w/o difficulty. Affect pleasant  ECG: NSR 72 No ST-T wave abnormalities. Personally reviewed    ASSESSMENT & PLAN: 1. Chronic systolic CHF due to NICM: -  Echo 01/13/17 LVEF 20-25%, trivial MR, mild LAE, RV mildly elevated, mild RAE. R/L heart cath Aug. 2016 with normal cors. CM felt to be related to ETOH and tachy-mediated. cMRI with EF 24%.  - ECHO 04/2017 EF 40-45%  - Echo 12/19 EF 50-55% - Echo today EF  50% - Stable NYHA I symptoms.  - Volume status stable - takes lasix about once a week.  - Continue spiro 12.5 mg qhs - Continue losartan 25 mg qhs. - BP too low to titrate further.  - Check labs   2. Atrial fibrillation: s/p DCCV - Continue amiodarone 100 mg daily. LFTs stable 01/2018. See discussion below about thyroid. - CHA2DS2/VASc at least 2.  - Continue Eliquis 5 mg BID. Denies bleeding.  - Has occasional irregular readings on BP cuff. - Recent zio 4/21. No AF  3. NSVT - Continue amiodarone 100 mg daily.  - Recent Zio reviewed  4. R Renal mass - s/p R renal nephrectomy 03/15/17.  - No change 5. Alcohol Abuse - Encouraged cessation.   6. CKD - Renal function stable 1.66 01/2018 - Repeat labs today  7. Hypothroidism - TSH better on last check 21 > 6.1 01/2018 - Continue synthroid 75 mcg daily - Followed by endo. Followed by Dr Buddy Duty.   Glori Bickers, MD 11/06/19

## 2019-11-06 NOTE — Progress Notes (Signed)
  Echocardiogram 2D Echocardiogram has been performed.  Theodore Weber 11/06/2019, 9:27 AM

## 2019-11-06 NOTE — Progress Notes (Signed)
Error

## 2019-11-06 NOTE — Addendum Note (Signed)
Encounter addended by: Shonna Chock, CMA on: 12/30/2749 10:19 AM  Actions taken: Order list changed, Diagnosis association updated, Clinical Note Signed

## 2019-11-20 ENCOUNTER — Telehealth: Payer: Self-pay

## 2019-11-20 NOTE — Telephone Encounter (Signed)
**Note De-Identified Takyah Ciaramitaro Obfuscation** We received a Spironolactone PA request from [place]. I did the PA through covermymeds: Key: [id number]  I then received a message through covermymeds as follows: Lyndal Pulley KeyElveria Royals - Rx #: [id number]  Outcome   Your PA request cannot be processed for the member plan submitted. For further inquiries please contact the number on the back of the member prescription card. (Message [phone])  Drug Spironolactone 25MG  tablets  Form[employer] Electronic PA Form [phone] NCPDP)   I called [facility] and was advised that the pt has a new Ins. plan now and that this PA is not required. The cost to the pt for his Spironolactone is now $5/30 day supply.

## 2019-12-21 ENCOUNTER — Other Ambulatory Visit (HOSPITAL_COMMUNITY): Payer: Self-pay | Admitting: Internal Medicine

## 2019-12-25 ENCOUNTER — Other Ambulatory Visit (HOSPITAL_COMMUNITY): Payer: Self-pay | Admitting: *Deleted

## 2019-12-25 MED ORDER — FUROSEMIDE 40 MG PO TABS: 40.0000 mg | ORAL_TABLET | Freq: Every day | ORAL | 6 refills | Status: DC | PRN

## 2020-02-25 ENCOUNTER — Other Ambulatory Visit: Payer: Self-pay | Admitting: Internal Medicine

## 2020-05-06 ENCOUNTER — Other Ambulatory Visit (HOSPITAL_COMMUNITY): Payer: Self-pay | Admitting: *Deleted

## 2020-05-06 MED ORDER — APIXABAN 5 MG PO TABS: ORAL_TABLET | ORAL | 5 refills | Status: DC

## 2020-07-29 ENCOUNTER — Other Ambulatory Visit (HOSPITAL_COMMUNITY): Payer: Self-pay | Admitting: Internal Medicine

## 2020-08-02 ENCOUNTER — Other Ambulatory Visit (HOSPITAL_COMMUNITY): Payer: Self-pay | Admitting: *Deleted

## 2020-08-02 MED ORDER — FUROSEMIDE 40 MG PO TABS: 40.0000 mg | ORAL_TABLET | Freq: Every day | ORAL | 6 refills | Status: DC | PRN

## 2020-08-14 ENCOUNTER — Other Ambulatory Visit: Payer: Self-pay

## 2020-08-14 ENCOUNTER — Ambulatory Visit (HOSPITAL_COMMUNITY)
Admission: RE | Admit: 2020-08-14 | Discharge: 2020-08-14 | Disposition: A | Discharge status: Home / Self Care | Payer: 59 | Source: Ambulatory Visit | Attending: Internal Medicine | Admitting: Internal Medicine

## 2020-08-14 ENCOUNTER — Encounter (HOSPITAL_COMMUNITY): Payer: Self-pay | Admitting: Internal Medicine

## 2020-08-14 VITALS — BP 124/70 | HR 73 | Wt 210.8 lb

## 2020-08-14 DIAGNOSIS — I480 Paroxysmal atrial fibrillation: Secondary | ICD-10-CM

## 2020-08-14 DIAGNOSIS — Z87891 Personal history of nicotine dependence: Secondary | ICD-10-CM | POA: Diagnosis not present

## 2020-08-14 DIAGNOSIS — Z7901 Long term (current) use of anticoagulants: Secondary | ICD-10-CM | POA: Insufficient documentation

## 2020-08-14 DIAGNOSIS — I5022 Chronic systolic (congestive) heart failure: Secondary | ICD-10-CM | POA: Diagnosis not present

## 2020-08-14 DIAGNOSIS — Z8249 Family history of ischemic heart disease and other diseases of the circulatory system: Secondary | ICD-10-CM | POA: Diagnosis not present

## 2020-08-14 DIAGNOSIS — I472 Ventricular tachycardia: Secondary | ICD-10-CM | POA: Diagnosis not present

## 2020-08-14 DIAGNOSIS — Z79899 Other long term (current) drug therapy: Secondary | ICD-10-CM | POA: Insufficient documentation

## 2020-08-14 DIAGNOSIS — Z905 Acquired absence of kidney: Secondary | ICD-10-CM | POA: Diagnosis not present

## 2020-08-14 DIAGNOSIS — F1010 Alcohol abuse, uncomplicated: Secondary | ICD-10-CM

## 2020-08-14 DIAGNOSIS — N529 Male erectile dysfunction, unspecified: Secondary | ICD-10-CM | POA: Insufficient documentation

## 2020-08-14 DIAGNOSIS — N183 Chronic kidney disease, stage 3 unspecified: Secondary | ICD-10-CM | POA: Diagnosis not present

## 2020-08-14 DIAGNOSIS — I130 Hypertensive heart and chronic kidney disease with heart failure and stage 1 through stage 4 chronic kidney disease, or unspecified chronic kidney disease: Secondary | ICD-10-CM | POA: Diagnosis not present

## 2020-08-14 DIAGNOSIS — N2889 Other specified disorders of kidney and ureter: Secondary | ICD-10-CM | POA: Diagnosis not present

## 2020-08-14 DIAGNOSIS — I428 Other cardiomyopathies: Secondary | ICD-10-CM | POA: Diagnosis not present

## 2020-08-14 DIAGNOSIS — N1831 Chronic kidney disease, stage 3a: Secondary | ICD-10-CM | POA: Insufficient documentation

## 2020-08-14 DIAGNOSIS — I4891 Unspecified atrial fibrillation: Secondary | ICD-10-CM | POA: Diagnosis not present

## 2020-08-14 LAB — BASIC METABOLIC PANEL
Anion gap: 6 (ref 5–15)
BUN: 28 mg/dL — ABNORMAL HIGH (ref 8–23)
CO2: 28 mmol/L (ref 22–32)
Calcium: 9.3 mg/dL (ref 8.9–10.3)
Chloride: 101 mmol/L (ref 98–111)
Creatinine, Ser: 1.9 mg/dL — ABNORMAL HIGH (ref 0.61–1.24)
GFR, Estimated: 39 mL/min — ABNORMAL LOW (ref >60)
Glucose, Bld: 100 mg/dL — ABNORMAL HIGH (ref 70–99)
Potassium: 4.8 mmol/L (ref 3.5–5.1)
Sodium: 135 mmol/L (ref 135–145)

## 2020-08-14 MED ORDER — EMPAGLIFLOZIN 10 MG PO TABS: 10.0000 mg | ORAL_TABLET | Freq: Every day | ORAL | 11 refills | Status: AC

## 2020-08-14 MED ORDER — SILDENAFIL CITRATE 100 MG PO TABS: 100.0000 mg | ORAL_TABLET | ORAL | 3 refills | Status: DC | PRN

## 2020-08-14 NOTE — Patient Instructions (Addendum)
Labs done today. We will contact you only if your labs are abnormal.  STOP taking Lasix.   START Jardiance 10mg  (1 tablet) by mouth daily.  START Viagra 100mg  (1 tablet) by mouth 1 hour prior to sex as needed.  No other medication changes were made. Please continue all current medications as prescribed.  Your physician recommends that you schedule a follow-up appointment in: 2 weeks for a lab only appointment and in 9 months for an appointment with Dr. [name]. Please contact our office in November for a December appointment.    If you have any questions or concerns before your next appointment please send [place] a message through [place] or call our office at [phone].    TO LEAVE A MESSAGE FOR THE NURSE SELECT OPTION 2, PLEASE LEAVE A MESSAGE INCLUDING: . YOUR NAME . DATE OF BIRTH . CALL BACK NUMBER . REASON FOR CALL**this is important as we prioritize the call backs  YOU WILL RECEIVE A CALL BACK THE SAME DAY AS LONG AS YOU CALL BEFORE 4:00 PM   Do the following things EVERYDAY: 1) Weigh yourself in the morning before breakfast. Write it down and keep it in a log. 2) Take your medicines as prescribed 3) Eat low salt foods--Limit salt (sodium) to 2000 mg per day.  4) Stay as active as you can everyday 5) Limit all fluids for the day to less than 2 liters   At the [facility], you and your health needs are our priority. As part of our continuing mission to provide you with exceptional heart care, we have created designated Provider Care Teams. These Care Teams include your primary Cardiologist (physician) and Advanced Practice Providers (APPs- Physician Assistants and Nurse Practitioners) who all work together to provide you with the care you need, when you need it.   You may see any of the following providers on your designated Care Team at your next follow up: Marland Kitchen Dr Glori Bickers . Dr Loralie Champagne . Darrick Grinder, NP . Lyda Jester, PA . Audry Riles,  PharmD   Please be sure to bring in all your medications bottles to every appointment.

## 2020-08-14 NOTE — Addendum Note (Signed)
Encounter addended by: Jolaine Artist, MD on: 08/14/2020 11:28 PM  Actions taken: Clinical Note Signed

## 2020-08-14 NOTE — Progress Notes (Addendum)
Advanced Heart Failure Clinic Note   Referring Physician: Dr. Gwenlyn Found Primary Care: No PCP Primary Cardiologist: Dr. Avelina Laine. [name]   HPI: Theodore Weber is a 65 y.o. male with past medical history of R renal mass, HTN, and ETOH abuse and chronic systolic CHF (EF 26-94%). S/p R nephrectomy 03/2017.   He was admitted 8/18 to [facility] as a transfer from [facility] with a several day history of flank pain. CT and US revealed R renal mass concerning for malignancy. Plan was for an elective R nephrectomy. On presentation to the OR he was in Afib RVR, so case was cancelled. Underwent nuclear stress test that showed no ischemia, severe LV HK with EF estimated 12%. Echo with EF 20-25%. He underwent TEE/DCCV, with return to NSR. He was started on Eliquis and amiodarone.   Underwent right tadical rephrectomy 03/15/17  Echo 12/19 EF 50-55%  He presents today for HF follow up. In November 2020 had a crush injury to his left hand. Eventually lost his left index finger. 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 Feels great. Remains active. Walking 5k 5 days a week. No edema, orthopnea or PND. No palpitations. No bleeding with Eliquis.    Zio 09/12/19  1. Sinus rhythm - avg HR of 76 2. 51 runs of brief SVT occurred, the run with the fastestinterval lasting 6 beats with a max rate of 207 bpm, the longest lasting 14 beats with an avg rate of 105 bpm. 3. Isolated PACs were occasional (1.7%,14998) 4. Isolated PVCs were occasional (3.1%, 85462) 5.  Ventricular bigeminy and rrigeminy were present. 6. Four patient-triggered events noted 3 associated with sinus rhythm. One associated with isolated PVC 7. No AF noted  Echo today 11/06/19 EF 50% Personally reviewed   ECHO 04/06/2017 EF 40-45% Grade II DD Bedside Echo 02/17/17 EF 30-35% Echo 05/10/18: EF 50-55%  Past Medical History:  Diagnosis Date  . Atrial fibrillation ([place]) 12/2016  . Cancer ([place])    kidney  .  Dysrhythmia   . HOH (hard of hearing)   . Hypertension   . Renal mass 12/2016  . Systolic dysfunction with acute on chronic heart failure ([place]) 01/15/2017  . Wears glasses    Current Outpatient Medications  Medication Sig Dispense Refill  . amiodarone (PACERONE) 200 MG tablet TAKE 1/2 TABLET(100 MG) BY MOUTH DAILY 45 tablet 3  . apixaban (ELIQUIS) 5 MG TABS tablet TAKE 1 TABLET(5 MG) BY MOUTH TWICE DAILY 60 tablet 5  . furosemide (LASIX) 40 MG tablet Take 1 tablet (40 mg total) by mouth daily as needed (For weight gain 3 lbs or more overnight, 5 lbs or more in 1 week). 30 tablet 6  . levothyroxine (SYNTHROID, LEVOTHROID) 75 MCG tablet Take 75 mcg by mouth daily before breakfast.    . losartan (COZAAR) 25 MG tablet TAKE 1 TABLET(25 MG) BY MOUTH DAILY 90 tablet 1  . OIL OF OREGANO PO Take 1 each by mouth See admin instructions. Take 1 dropper full by mouth daily    . spironolactone (ALDACTONE) 25 MG tablet TAKE 1/2 TABLET(12.5 MG) BY MOUTH AT BEDTIME 15 tablet 11   No current facility-administered medications for this encounter.   No Known Allergies  Social History   Socioeconomic History  . Marital status: Married    Spouse name: Not on file  . Number of children: Not on file  . Years of education: Not on file  . Highest education level: Not on file  Occupational History  .  Not on file  Tobacco Use  . Smoking status: Former Smoker    Types: Cigars    Quit date: 01/09/2017    Years since quitting: 3.5  . Smokeless tobacco: Current User    Types: Snuff  Vaping Use  . Vaping Use: Never used  Substance and Sexual Activity  . Alcohol use: Yes    Alcohol/week: 3.0 standard drinks    Types: 3 Cans of beer per week    Comment: Occasionally  . Drug use: No  . Sexual activity: Not on file  Other Topics Concern  . Not on file  Social History Narrative  . Not on file   Social Determinants of Health   Financial Resource Strain: Not on file  Food Insecurity: Not on file   Transportation Needs: Not on file  Physical Activity: Not on file  Stress: Not on file  Social Connections: Not on file  Intimate Partner Violence: Not on file   Family History  Problem Relation Age of Onset  . Hypertension Father    Vitals:   08/14/20 1253  BP: 124/70  Pulse: 73  SpO2: 100%  Weight: 95.6 kg (210 lb 12.8 oz)   Wt Readings from Last 3 Encounters:  08/14/20 95.6 kg (210 lb 12.8 oz)  11/06/19 91.1 kg (200 lb 12.8 oz)  05/01/19 85.3 kg (188 lb)   PHYSICAL EXAM: General:  Well appearing. No resp difficulty HEENT: normal Neck: supple. no JVD. Carotids 2+ bilat; no bruits. No lymphadenopathy or thryomegaly appreciated. Cor: PMI nondisplaced. Regular rate & rhythm. No rubs, gallops or murmurs. Lungs: clear Abdomen: soft, nontender, nondistended. No hepatosplenomegaly. No bruits or masses. Good bowel sounds. Extremities: no cyanosis, clubbing, rash, edema Neuro: alert & orientedx3, cranial nerves grossly intact. moves all 4 extremities w/o difficulty. Affect pleasant    ASSESSMENT & PLAN: 1. Chronic systolic CHF due to NICM with recovered EF: -  Echo 01/13/17 LVEF 20-25%, trivial MR, mild LAE, RV mildly elevated, mild RAE. R/L heart cath Aug. 2016 with normal cors. CM felt to be related to ETOH and tachy-mediated. cMRI with EF 24%.  - ECHO 04/2017 EF 40-45%  - Echo 12/19 EF 50-55% - Echo 6/21 EF 50% - Stable NYHA I - Volume status stable - takes lasix about 1-2x/week - Continue spiro 12.5 mg qhs - Continue losartan 25 mg qhs. - With HF and CKD will switch lasix to Jardiance - check labs today and in 1 week   2. Atrial fibrillation: s/p DCCV - Remains in NSR. Continue amiodarone 100 mg daily. LFTs stable 01/2018. See discussion below about thyroid. - CHA2DS2/VASc at least 2.  - Continue Eliquis 5 mg BID. No bleeding - Zio 4/21. No AF - Now that no longer drinking and EF improved can consider stopping amio in near future  3. NSVT - Continue amiodarone 100  mg daily.  - Zio reviewed - No change  4. R Renal mass - s/p R renal nephrectomy 03/15/17.  - No change  5. Alcohol Abuse - Encouraged cessation.   6. CKD 3a - Baseline creatinine about 1.6  - recheck today - strting SGLT2i  7. Hypothroidism - TSH better on last check 21 > 6.1 01/2018 - Continue synthroid 75 mcg daily - Followed by Dr Buddy Duty in Endo   8. Erectile dysfunction - start sildenafil   Glori Bickers, MD 08/14/20

## 2020-08-14 NOTE — Addendum Note (Signed)
Encounter addended by: Jolaine Artist, MD on: 08/14/2020 11:14 PM  Actions taken: Level of Service modified, Visit diagnoses modified

## 2020-08-19 ENCOUNTER — Other Ambulatory Visit (HOSPITAL_COMMUNITY): Payer: Self-pay | Admitting: *Deleted

## 2020-08-19 MED ORDER — SILDENAFIL CITRATE 100 MG PO TABS: 100.0000 mg | ORAL_TABLET | ORAL | 3 refills | Status: AC | PRN

## 2020-08-28 ENCOUNTER — Other Ambulatory Visit: Payer: Self-pay

## 2020-08-28 ENCOUNTER — Ambulatory Visit (HOSPITAL_COMMUNITY)
Admission: RE | Admit: 2020-08-28 | Discharge: 2020-08-28 | Disposition: A | Discharge status: Home / Self Care | Payer: 59 | Source: Ambulatory Visit | Attending: Internal Medicine | Admitting: Internal Medicine

## 2020-08-28 DIAGNOSIS — I5022 Chronic systolic (congestive) heart failure: Secondary | ICD-10-CM | POA: Diagnosis not present

## 2020-08-28 LAB — BASIC METABOLIC PANEL
Anion gap: 9 (ref 5–15)
BUN: 19 mg/dL (ref 8–23)
CO2: 24 mmol/L (ref 22–32)
Calcium: 9.2 mg/dL (ref 8.9–10.3)
Chloride: 101 mmol/L (ref 98–111)
Creatinine, Ser: 1.9 mg/dL — ABNORMAL HIGH (ref 0.61–1.24)
GFR, Estimated: 39 mL/min — ABNORMAL LOW (ref >60)
Glucose, Bld: 88 mg/dL (ref 70–99)
Potassium: 4.1 mmol/L (ref 3.5–5.1)
Sodium: 134 mmol/L — ABNORMAL LOW (ref 135–145)

## 2020-11-08 ENCOUNTER — Other Ambulatory Visit (HOSPITAL_COMMUNITY): Payer: Self-pay

## 2020-11-08 MED ORDER — ELIQUIS 5 MG PO TABS: ORAL_TABLET | ORAL | 4 refills | Status: DC

## 2020-12-20 ENCOUNTER — Other Ambulatory Visit (HOSPITAL_COMMUNITY): Payer: Self-pay | Admitting: Internal Medicine

## 2021-02-13 ENCOUNTER — Other Ambulatory Visit (HOSPITAL_COMMUNITY): Payer: Self-pay | Admitting: *Deleted

## 2021-02-13 MED ORDER — APIXABAN 5 MG PO TABS: ORAL_TABLET | ORAL | 2 refills | Status: AC

## 2021-02-21 ENCOUNTER — Telehealth (HOSPITAL_COMMUNITY): Payer: Self-pay | Admitting: *Deleted

## 2021-02-21 MED ORDER — LOSARTAN POTASSIUM 25 MG PO TABS: ORAL_TABLET | ORAL | 3 refills | Status: AC

## 2021-02-21 NOTE — Telephone Encounter (Signed)
Refill encounter ?

## 2021-03-06 ENCOUNTER — Other Ambulatory Visit (HOSPITAL_COMMUNITY): Payer: Self-pay | Admitting: *Deleted

## 2021-03-06 ENCOUNTER — Other Ambulatory Visit: Payer: Self-pay | Admitting: Internal Medicine

## 2021-04-11 ENCOUNTER — Telehealth (HOSPITAL_COMMUNITY): Payer: Self-pay | Admitting: *Deleted

## 2021-04-11 NOTE — Telephone Encounter (Signed)
Pt had labs drawn at [facility] and was advised to report lab results to his cardiologist. Pt asked if he needs to adjust medications.      GFR 41  Potassium 5.4 Creatinine 1.82  Last labs below:  Component Ref Range & Units 7 mo ago  (08/28/20) 8 mo ago  (08/14/20) 1 yr ago  (11/06/19) 1 yr ago  (05/01/19) 2 yr ago  (04/03/19) 2 yr ago  (01/02/19) 3 yr ago  (02/07/18)  Sodium 135 - 145 mmol/L 134 Low   135  128 Low   131 Low   134 Low   136  135   Potassium 3.5 - 5.1 mmol/L 4.1  4.8  4.2  4.8  3.9  4.3  4.2   Chloride 98 - 111 mmol/L 101  101  95 Low   95 Low   100  104  99   CO2 22 - 32 mmol/L 24  28  22  27  21  Low   24  25   Glucose, Bld 70 - 99 mg/dL 88  100 High  CM  117 High  CM  95  144 High   90  95     Routed to Dr.Bensimhon for advice

## 2021-04-15 NOTE — Telephone Encounter (Signed)
Patient advised and verbalized understanding 

## 2021-05-15 NOTE — Progress Notes (Addendum)
Advanced Heart Failure Clinic Note   Referring Physician: Dr. Gwenlyn Found Primary Care: No PCP Primary Cardiologist: Dr. Avelina Laine. Theodore Weber   HPI: Theodore Weber is a 65 y.o. male with past medical history of R renal mass, HTN, and ETOH abuse and chronic systolic CHF (EF 93-81%). S/p R nephrectomy 03/2017.   Admitted 8/18 to [facility] as a transfer from [facility] with a several day history of flank pain. CT and US revealed rena cell CA.  Plan was for an elective R nephrectomy. On presentation to the OR he was in Afib RVR, so case was cancelled. Underwent nuclear stress test that showed no ischemia, severe LV HK with EF estimated 12%. Echo with EF 20-25%. He underwent TEE/DCCV, with return to NSR. He was started on Eliquis and amiodarone.   Underwent right radical rephrectomy 03/15/17  In November 2020 had a crush injury to his left hand. Eventually lost his left index finger. 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  Echo 12/19 EF 50-55%  He presents today for HF follow up.  Now going to [place] for [employer]. Feels great. Walking 80 miles per month. No CP or SOB. No edema, orthopnea or PND    Zio 09/12/19  1. Sinus rhythm - avg HR of 76 2. 51 runs of brief SVT occurred, the run with the fastestinterval lasting 6 beats with a max rate of 207 bpm, the longest lasting 14 beats with an avg rate of 105 bpm. 3. Isolated PACs were occasional (1.7%,14998) 4. Isolated PVCs were occasional (3.1%, 82993) 5.  Ventricular bigeminy and rrigeminy were present. 6. Four patient-triggered events noted 3 associated with sinus rhythm. One associated with isolated PVC 7. No AF noted  Echo today 11/06/19 EF 50% Personally reviewed   ECHO 04/06/2017 EF 40-45% Grade II DD Bedside Echo 02/17/17 EF 30-35% Echo 05/10/18: EF 50-55%  Past Medical History:  Diagnosis Date   Atrial fibrillation ([place]) 12/2016   Cancer ([place])    kidney   Dysrhythmia    HOH (hard of hearing)    Hypertension    Renal mass 71/6967    Systolic dysfunction with acute on chronic heart failure (HCC) 01/15/2017   Wears glasses    Current Outpatient Medications  Medication Sig Dispense Refill   amiodarone (PACERONE) 200 MG tablet TAKE 1/2 TABLET(100 MG) BY MOUTH DAILY 90 tablet 3   apixaban (ELIQUIS) 5 MG TABS tablet TAKE 1 TABLET(5 MG) BY MOUTH TWICE DAILY 60 tablet 2   empagliflozin (JARDIANCE) 10 MG TABS tablet Take 1 tablet (10 mg total) by mouth daily before breakfast. 30 tablet 11   levothyroxine (SYNTHROID, LEVOTHROID) 75 MCG tablet Take 75 mcg by mouth daily before breakfast.     losartan (COZAAR) 25 MG tablet TAKE 1 TABLET(25 MG) BY MOUTH DAILY 90 tablet 3   OIL OF OREGANO PO Take 1 each by mouth See admin instructions. Take 1 dropper full by mouth daily     sildenafil (VIAGRA) 100 MG tablet Take 1 tablet (100 mg total) by mouth as needed for erectile dysfunction. 6 tablet 3   spironolactone (ALDACTONE) 25 MG tablet TAKE 1/2 TABLET(12.5 MG) BY MOUTH AT BEDTIME 15 tablet 4   No current facility-administered medications for this encounter.   No Known Allergies  Social History   Socioeconomic History   Marital status: Married    Spouse name: Not on file   Number of children: Not on file   Years of education: Not on file   Highest education level: Not on file  Occupational History   Not on file  Tobacco Use   Smoking status: Former    Types: Cigars    Quit date: 01/09/2017    Years since quitting: 4.3   Smokeless tobacco: Current    Types: Snuff  Vaping Use   Vaping Use: Never used  Substance and Sexual Activity   Alcohol use: Yes    Alcohol/week: 3.0 standard drinks    Types: 3 Cans of beer per week    Comment: Occasionally   Drug use: No   Sexual activity: Not on file  Other Topics Concern   Not on file  Social History Narrative   Not on file   Social Determinants of Health   Financial Resource Strain: Not on file  Food Insecurity: Not on file  Transportation Needs: Not on file  Physical  Activity: Not on file  Stress: Not on file  Social Connections: Not on file  Intimate Partner Violence: Not on file   Family History  Problem Relation Age of Onset   Hypertension Father    Vitals:   05/16/21 0845  BP: 120/78  Pulse: 70  SpO2: 99%  Weight: 93.3 kg (205 lb 9.6 oz)    Wt Readings from Last 3 Encounters:  05/16/21 93.3 kg (205 lb 9.6 oz)  08/14/20 95.6 kg (210 lb 12.8 oz)  11/06/19 91.1 kg (200 lb 12.8 oz)   PHYSICAL EXAM: General:  Well appearing. No resp difficulty HEENT: normal Neck: supple. no JVD. Carotids 2+ bilat; no bruits. No lymphadenopathy or thryomegaly appreciated. Cor: PMI nondisplaced. Regular rate & rhythm. No rubs, gallops or murmurs. Lungs: clear Abdomen: soft, nontender, nondistended. No hepatosplenomegaly. No bruits or masses. Good bowel sounds. Extremities: no cyanosis, clubbing, rash, edema Left hand deformity Neuro: alert & orientedx3, cranial nerves grossly intact. moves all 4 extremities w/o difficulty. Affect pleasant  ECG: NSR 67 No ST-T wave abnormalities. Personally reviewed   ASSESSMENT & PLAN:  1. Chronic systolic CHF due to NICM with recovered EF: -  Echo 01/13/17 LVEF 20-25%, trivial MR, mild LAE, RV mildly elevated, mild RAE. R/L heart cath Aug. 2016 with normal cors. CM felt to be related to ETOH and tachy-mediated. cMRI with EF 24%.  - ECHO 04/2017 EF 40-45%  - Echo 12/19 EF 50-55% - Echo 6/21 EF 50% - Doing great. Stable NYHA I - Volume status stable - Continue spiro 12.5 mg qhs - Continue losartan 25 mg qhs. - [facility] [place] - Add Toprol 25 - check labs today  - RTC 12 months with echo. He is also establishing care at [facility]  2. Atrial fibrillation: s/p DCCV - Remains in NSR. - CHA2DS2/VASc at least 2.  - Continue Eliquis 5 mg BID. No bleeding - Zio 4/21. No AF - Now that no longer drinking and EF improved will stop amio. He will continue to monitor HR and BP daily. Let me know if HR  changes  3. NSVT - Stop amio. Switched to Toprol - Zio reviewed  4. R Renal mass - s/p R renal nephrectomy 03/15/17.  - No change  5. Alcohol Abuse - He has quit   6. CKD 3a-3b - Baseline creatinine about 1.6-1.9  - recheck today - [facility] [place]  7. Hypothroidism - TSH better on last check 21 > 6.1 01/2018 - Continue synthroid 75 mcg daily - Followed by Dr Buddy Duty in Endo   8. Erectile dysfunction - prn sildenafil   Glori Bickers, MD 05/16/21

## 2021-05-16 ENCOUNTER — Ambulatory Visit (HOSPITAL_COMMUNITY)
Admission: RE | Admit: 2021-05-16 | Discharge: 2021-05-16 | Disposition: A | Discharge status: Home / Self Care | Payer: 59 | Source: Ambulatory Visit | Attending: Internal Medicine | Admitting: Internal Medicine

## 2021-05-16 ENCOUNTER — Other Ambulatory Visit: Payer: Self-pay

## 2021-05-16 ENCOUNTER — Encounter (HOSPITAL_COMMUNITY): Payer: Self-pay | Admitting: Internal Medicine

## 2021-05-16 VITALS — BP 120/78 | HR 70 | Wt 205.6 lb

## 2021-05-16 DIAGNOSIS — I428 Other cardiomyopathies: Secondary | ICD-10-CM | POA: Insufficient documentation

## 2021-05-16 DIAGNOSIS — I480 Paroxysmal atrial fibrillation: Secondary | ICD-10-CM

## 2021-05-16 DIAGNOSIS — Z89022 Acquired absence of left finger(s): Secondary | ICD-10-CM | POA: Insufficient documentation

## 2021-05-16 DIAGNOSIS — Z7984 Long term (current) use of oral hypoglycemic drugs: Secondary | ICD-10-CM | POA: Insufficient documentation

## 2021-05-16 DIAGNOSIS — N529 Male erectile dysfunction, unspecified: Secondary | ICD-10-CM | POA: Diagnosis not present

## 2021-05-16 DIAGNOSIS — E039 Hypothyroidism, unspecified: Secondary | ICD-10-CM | POA: Diagnosis not present

## 2021-05-16 DIAGNOSIS — Z79890 Hormone replacement therapy (postmenopausal): Secondary | ICD-10-CM | POA: Diagnosis not present

## 2021-05-16 DIAGNOSIS — I130 Hypertensive heart and chronic kidney disease with heart failure and stage 1 through stage 4 chronic kidney disease, or unspecified chronic kidney disease: Secondary | ICD-10-CM | POA: Insufficient documentation

## 2021-05-16 DIAGNOSIS — I471 Supraventricular tachycardia: Secondary | ICD-10-CM | POA: Diagnosis not present

## 2021-05-16 DIAGNOSIS — Z79899 Other long term (current) drug therapy: Secondary | ICD-10-CM | POA: Diagnosis not present

## 2021-05-16 DIAGNOSIS — N1831 Chronic kidney disease, stage 3a: Secondary | ICD-10-CM | POA: Diagnosis not present

## 2021-05-16 DIAGNOSIS — F1091 Alcohol use, unspecified, in remission: Secondary | ICD-10-CM | POA: Diagnosis not present

## 2021-05-16 DIAGNOSIS — I4891 Unspecified atrial fibrillation: Secondary | ICD-10-CM | POA: Diagnosis not present

## 2021-05-16 DIAGNOSIS — I472 Ventricular tachycardia, unspecified: Secondary | ICD-10-CM | POA: Insufficient documentation

## 2021-05-16 DIAGNOSIS — R008 Other abnormalities of heart beat: Secondary | ICD-10-CM | POA: Diagnosis not present

## 2021-05-16 DIAGNOSIS — I5022 Chronic systolic (congestive) heart failure: Secondary | ICD-10-CM | POA: Insufficient documentation

## 2021-05-16 DIAGNOSIS — N183 Chronic kidney disease, stage 3 unspecified: Secondary | ICD-10-CM | POA: Diagnosis not present

## 2021-05-16 DIAGNOSIS — Z905 Acquired absence of kidney: Secondary | ICD-10-CM | POA: Insufficient documentation

## 2021-05-16 DIAGNOSIS — I493 Ventricular premature depolarization: Secondary | ICD-10-CM | POA: Insufficient documentation

## 2021-05-16 DIAGNOSIS — Z7901 Long term (current) use of anticoagulants: Secondary | ICD-10-CM | POA: Insufficient documentation

## 2021-05-16 LAB — COMPREHENSIVE METABOLIC PANEL
ALT: 15 U/L (ref 0–44)
AST: 18 U/L (ref 15–41)
Albumin: 4.2 g/dL (ref 3.5–5.0)
Alkaline Phosphatase: 39 U/L (ref 38–126)
Anion gap: 7 (ref 5–15)
BUN: 17 mg/dL (ref 8–23)
CO2: 25 mmol/L (ref 22–32)
Calcium: 9.1 mg/dL (ref 8.9–10.3)
Chloride: 100 mmol/L (ref 98–111)
Creatinine, Ser: 1.77 mg/dL — ABNORMAL HIGH (ref 0.61–1.24)
GFR, Estimated: 42 mL/min — ABNORMAL LOW (ref >60)
Glucose, Bld: 94 mg/dL (ref 70–99)
Potassium: 5 mmol/L (ref 3.5–5.1)
Sodium: 132 mmol/L — ABNORMAL LOW (ref 135–145)
Total Bilirubin: 0.9 mg/dL (ref 0.3–1.2)
Total Protein: 7.1 g/dL (ref 6.5–8.1)

## 2021-05-16 LAB — CBC
HCT: 39.5 % (ref 39.0–52.0)
Hemoglobin: 13.6 g/dL (ref 13.0–17.0)
MCH: 32 pg (ref 26.0–34.0)
MCHC: 34.4 g/dL (ref 30.0–36.0)
MCV: 92.9 fL (ref 80.0–100.0)
Platelets: 311 10*3/uL (ref 150–400)
RBC: 4.25 MIL/uL (ref 4.22–5.81)
RDW: 12.6 % (ref 11.5–15.5)
WBC: 6.6 10*3/uL (ref 4.0–10.5)
nRBC: 0 % (ref 0.0–0.2)

## 2021-05-16 MED ORDER — METOPROLOL SUCCINATE ER 25 MG PO TB24: 25.0000 mg | ORAL_TABLET | Freq: Every day | ORAL | 11 refills | Status: AC

## 2021-05-16 NOTE — Patient Instructions (Signed)
STOP Amiodarone  START Toprol XL 25mg  (1 tab) daily  Your physician has requested that you have an echocardiogram. Echocardiography is a painless test that uses sound waves to create images of your heart. It provides your doctor with information about the size and shape of your heart and how well your hearts chambers and valves are working. This procedure takes approximately one hour. There are no restrictions for this procedure.  Your physician recommends that you schedule a follow-up appointment in: 1 year with an ECHO. You will get a call closer to the fall to schedule. Please call in September 2023 if you do not get a call.  Please call office at [phone] option 2 if you have any questions or concerns.   Do the following things EVERYDAY: Weigh yourself in the morning before breakfast. Write it down and keep it in a log. Take your medicines as prescribed Eat low salt foods--Limit salt (sodium) to 2000 mg per day.  Stay as active as you can everyday Limit all fluids for the day to less than 2 liters  At the [facility], you and your health needs are our priority. As part of our continuing mission to provide you with exceptional heart care, we have created designated Provider Care Teams. These Care Teams include your primary Cardiologist (physician) and Advanced Practice Providers (APPs- Physician Assistants and Nurse Practitioners) who all work together to provide you with the care you need, when you need it.   You may see any of the following providers on your designated Care Team at your next follow up: Dr Glori Bickers Dr Haynes Kerns, NP Lyda Jester, [place] [facility] [place], [place] Audry Riles, PharmD   Please be sure to bring in all your medications bottles to every appointment.

## 2021-05-16 NOTE — Addendum Note (Signed)
Encounter addended by: Maple Mirza, RN on: 05/16/2021 9:43 AM  Actions taken: Medication long-term status modified, Order list changed, Diagnosis association updated, Charge Capture section accepted, Clinical Note Signed

## 2021-09-29 ENCOUNTER — Telehealth (HOSPITAL_COMMUNITY): Payer: Self-pay | Admitting: *Deleted

## 2021-09-29 NOTE — Telephone Encounter (Signed)
Records faxed to Dr.Christopher Ronnald Ramp at [facility] per pts request. ? ?Fax # [phone] ?Phone # [phone] ?

## 2022-01-20 ENCOUNTER — Other Ambulatory Visit (HOSPITAL_COMMUNITY): Payer: Self-pay | Admitting: *Deleted

## 2022-01-20 DIAGNOSIS — I5022 Chronic systolic (congestive) heart failure: Secondary | ICD-10-CM

## 2022-05-20 NOTE — Progress Notes (Signed)
Advanced Heart Failure Clinic Note   Referring Physician: Dr. Gwenlyn Weber Primary Care: No PCP Primary Cardiologist: Dr. Avelina Weber. Theodore Weber   HPI: Theodore Weber is a 66 y.o. male with past medical history of R renal mass, HTN, and ETOH abuse and chronic systolic CHF (EF 09-81%). S/p R nephrectomy 03/2017.   Admitted 8/18 to [facility] as a transfer from [facility] with a several day history of flank pain. CT and US revealed rena cell CA.  Plan was for an elective R nephrectomy. On presentation to the OR he was in Afib RVR, so case was cancelled. Underwent nuclear stress test that showed no ischemia, severe LV HK with EF estimated 12%. Echo with EF 20-25%. He underwent TEE/DCCV, with return to NSR. He was started on Eliquis and amiodarone.   Underwent right radical rephrectomy 03/15/17  In November 2020 had a crush injury to his left hand. Eventually lost his left index finger. 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 Sinus rhythm - avg HR of 76 2. 51 runs of brief SVT occurred, the run with the fastestinterval lasting 6 beats with a max rate of 207 bpm, the longest lasting 14 beats with an avg rate of 105 bpm. 3. Isolated PACs were occasional (1.7%,14998) 4. Isolated PVCs were occasional (3.1%, 19147) 5.  Ventricular bigeminy and rrigeminy were present. 6. Four patient-triggered events noted 3 associated with sinus rhythm. One associated with isolated PVC 7. No AF noted  Echo 11/06/19 EF 50% Personally reviewed  He is here for yearly f/u. Feels great. Working on his associates degree. Denies CP, SOB, orthopnea or PND. Compliant with meds. No palpitations. Walking regularly on trails.   Echo today EF 45-50%   Past Medical History:  Diagnosis Date   Atrial fibrillation ([place]) 12/2016   Cancer ([place])    kidney   Dysrhythmia    HOH (hard of hearing)    Hypertension    Renal mass 82/9562   Systolic dysfunction with acute on chronic heart failure ([place]) 01/15/2017    Wears glasses    Current Outpatient Medications  Medication Sig Dispense Refill   apixaban (ELIQUIS) 5 MG TABS tablet TAKE 1 TABLET(5 MG) BY MOUTH TWICE DAILY 60 tablet 2   empagliflozin (JARDIANCE) 10 MG TABS tablet Take 1 tablet (10 mg total) by mouth daily before breakfast. 30 tablet 11   levothyroxine (SYNTHROID, LEVOTHROID) 75 MCG tablet Take 75 mcg by mouth daily before breakfast.     losartan (COZAAR) 25 MG tablet TAKE 1 TABLET(25 MG) BY MOUTH DAILY 90 tablet 3   metoprolol succinate (TOPROL XL) 25 MG 24 hr tablet Take 1 tablet (25 mg total) by mouth daily. 30 tablet 11   OIL OF OREGANO PO Take 1 each by mouth See admin instructions. Take 1 dropper full by mouth daily     sildenafil (VIAGRA) 100 MG tablet Take 1 tablet (100 mg total) by mouth as needed for erectile dysfunction. 6 tablet 3   spironolactone (ALDACTONE) 25 MG tablet TAKE 1/2 TABLET(12.5 MG) BY MOUTH AT BEDTIME 15 tablet 4   No current facility-administered medications for this encounter.   Facility-Administered Medications Ordered in Other Encounters  Medication Dose Route Frequency Provider Last Rate Last Admin   perflutren lipid microspheres (DEFINITY) IV suspension  1-10 mL Intravenous PRN Theodore Weber, Theodore Pascal, MD   2 mL at 05/21/22 1011   No Known Allergies  Social History   Socioeconomic History   Marital status: Married  Spouse name: Not on file   Number of children: Not on file   Years of education: Not on file   Highest education level: Not on file  Occupational History   Not on file  Tobacco Use   Smoking status: Former    Types: Cigars    Quit date: 01/09/2017    Years since quitting: 5.3   Smokeless tobacco: Current    Types: Snuff  Vaping Use   Vaping Use: Never used  Substance and Sexual Activity   Alcohol use: Yes    Alcohol/week: 3.0 standard drinks of alcohol    Types: 3 Cans of beer per week    Comment: Occasionally   Drug use: No   Sexual activity: Not on file  Other Topics Concern    Not on file  Social History Narrative   Not on file   Social Determinants of Health   Financial Resource Strain: Not on file  Food Insecurity: Not on file  Transportation Needs: Not on file  Physical Activity: Not on file  Stress: Not on file  Social Connections: Not on file  Intimate Partner Violence: Not on file   Family History  Problem Relation Age of Onset   Hypertension Father    Vitals:   05/21/22 1022  BP: 100/60  Pulse: 64  SpO2: 100%  Weight: 94.8 kg (209 lb)     Wt Readings from Last 3 Encounters:  05/21/22 94.8 kg (209 lb)  05/16/21 93.3 kg (205 lb 9.6 oz)  08/14/20 95.6 kg (210 lb 12.8 oz)   PHYSICAL EXAM: General:  Well appearing. No resp difficulty HEENT: normal Neck: supple. no JVD. Carotids 2+ bilat; no bruits. No lymphadenopathy or thryomegaly appreciated. Cor: PMI nondisplaced. Regular rate & rhythm. No rubs, gallops or murmurs. Lungs: clear Abdomen: soft, nontender, nondistended. No hepatosplenomegaly. No bruits or masses. Good bowel sounds. Extremities: no cyanosis, clubbing, rash, edema Left hand deformity Neuro: alert & orientedx3, cranial nerves grossly intact. moves all 4 extremities w/o difficulty. Affect pleasant  ECG: NSR 70 No ST-T wave abnormalities. Personally reviewed   ASSESSMENT & PLAN:  1. Chronic systolic CHF due to NICM with recovered EF: -  Echo 01/13/17 LVEF 20-25%, trivial MR, mild LAE, RV mildly elevated, mild RAE. R/L heart cath Aug. 2016 with normal cors. CM felt to be related to ETOH and tachy-mediated. cMRI with EF 24%.  - ECHO 04/2017 EF 40-45%  - Echo 12/19 EF 50-55% - Echo 6/21 EF 50% - Echo today 05/21/22 EF 45-50% - Doing great. Stable NYHA I - Volume status stable - Continue spiro 12.5 mg qhs - Continue losartan 25 mg qhs. - Continue Jardiance 10  - Continue Toprol 25 - BP too low to titrate meds - Recent labs at [facility] ok   2. Atrial fibrillation: s/p DCCV - Remains in NSR. - CHA2DS2/VASc at least 2.  -  Continue Eliquis 5 mg BID. No bleeding - Zio 4/21. No AF - Maintaining NSR off amio   3. NSVT - Continue b-blocker  4. R Renal mass - s/p R renal nephrectomy 03/15/17.  - No change - Followed by Dr. Alinda Money. No evidence of recurrence on recent scans.   5. Alcohol Abuse - Now only drinking occasional beer  6. CKD 3a-3b - Baseline creatinine about 1.6-1.9  - Followed by [facility]. Labs stable - Continue SGLT2i  7. Hypothroidism - Followed by Dr Buddy Duty in [place], MD 05/21/22

## 2022-05-21 ENCOUNTER — Ambulatory Visit (HOSPITAL_COMMUNITY)
Admission: RE | Admit: 2022-05-21 | Discharge: 2022-05-21 | Disposition: A | Discharge status: Home / Self Care | Payer: PPO | Source: Ambulatory Visit | Attending: Internal Medicine | Admitting: Internal Medicine

## 2022-05-21 ENCOUNTER — Encounter (HOSPITAL_COMMUNITY): Payer: Self-pay | Admitting: Internal Medicine

## 2022-05-21 VITALS — BP 100/60 | HR 64 | Wt 209.0 lb

## 2022-05-21 DIAGNOSIS — I4891 Unspecified atrial fibrillation: Secondary | ICD-10-CM | POA: Diagnosis not present

## 2022-05-21 DIAGNOSIS — Z7901 Long term (current) use of anticoagulants: Secondary | ICD-10-CM | POA: Insufficient documentation

## 2022-05-21 DIAGNOSIS — I5022 Chronic systolic (congestive) heart failure: Secondary | ICD-10-CM

## 2022-05-21 DIAGNOSIS — F1010 Alcohol abuse, uncomplicated: Secondary | ICD-10-CM | POA: Diagnosis not present

## 2022-05-21 DIAGNOSIS — I472 Ventricular tachycardia, unspecified: Secondary | ICD-10-CM | POA: Insufficient documentation

## 2022-05-21 DIAGNOSIS — Z79899 Other long term (current) drug therapy: Secondary | ICD-10-CM | POA: Insufficient documentation

## 2022-05-21 DIAGNOSIS — I130 Hypertensive heart and chronic kidney disease with heart failure and stage 1 through stage 4 chronic kidney disease, or unspecified chronic kidney disease: Secondary | ICD-10-CM | POA: Insufficient documentation

## 2022-05-21 DIAGNOSIS — Z905 Acquired absence of kidney: Secondary | ICD-10-CM | POA: Insufficient documentation

## 2022-05-21 DIAGNOSIS — N1831 Chronic kidney disease, stage 3a: Secondary | ICD-10-CM | POA: Insufficient documentation

## 2022-05-21 LAB — ECHOCARDIOGRAM COMPLETE
Area-P 1/2: 3.03 cm2
S' Lateral: 4 cm

## 2022-05-21 MED ORDER — PERFLUTREN LIPID MICROSPHERE
1.0000 mL | INTRAVENOUS | Status: DC | PRN
  Administered 2022-05-21: 2 mL via INTRAVENOUS

## 2022-05-21 NOTE — Progress Notes (Signed)
  Echocardiogram 2D Echocardiogram has been performed.  Theodore Weber 05/21/2022, 10:15 AM

## 2022-05-21 NOTE — Patient Instructions (Addendum)
No Labs done today.  No medication changes were made. Please continue all current medications as prescribed.  Your physician recommends that you schedule a follow-up appointment in: 1 year with an echo prior to your appointment. Please contact our office in November 2024 to schedule a December 2024 appointment.   If you have any questions or concerns before your next appointment please send [place] a message through [place] or call our office at [phone].    TO LEAVE A MESSAGE FOR THE NURSE SELECT OPTION 2, PLEASE LEAVE A MESSAGE INCLUDING: YOUR NAME DATE OF BIRTH CALL BACK NUMBER REASON FOR CALL**this is important as we prioritize the call backs  YOU WILL RECEIVE A CALL BACK THE SAME DAY AS LONG AS YOU CALL BEFORE 4:00 PM   Do the following things EVERYDAY: Weigh yourself in the morning before breakfast. Write it down and keep it in a log. Take your medicines as prescribed Eat low salt foods--Limit salt (sodium) to 2000 mg per day.  Stay as active as you can everyday Limit all fluids for the day to less than 2 liters   At the [facility], you and your health needs are our priority. As part of our continuing mission to provide you with exceptional heart care, we have created designated Provider Care Teams. These Care Teams include your primary Cardiologist (physician) and Advanced Practice Providers (APPs- Physician Assistants and Nurse Practitioners) who all work together to provide you with the care you need, when you need it.   You may see any of the following providers on your designated Care Team at your next follow up: Dr Glori Bickers Dr Haynes Kerns, NP Lyda Jester, [place] Audry Riles, PharmD   Please be sure to bring in all your medications bottles to every appointment.

## 2022-11-12 ENCOUNTER — Telehealth (HOSPITAL_COMMUNITY): Payer: Self-pay | Admitting: Cardiology

## 2022-11-12 NOTE — Telephone Encounter (Signed)
[facility] ([facility] Escitalopram 10 mg daily Mirtazapine 15 mg q HS -wanted to confirm ok to start from cardiology stand point

## 2023-02-10 ENCOUNTER — Other Ambulatory Visit (HOSPITAL_COMMUNITY): Payer: Self-pay | Admitting: Cardiology

## 2023-02-10 DIAGNOSIS — I5022 Chronic systolic (congestive) heart failure: Secondary | ICD-10-CM

## 2023-05-10 NOTE — Progress Notes (Signed)
Advanced Heart Failure Clinic Note   Referring Physician: Dr. Allyson Sabal Primary Care: No PCP Primary Cardiologist: Dr. Margo Aye. Gala Romney   CC: Heart failure follow-up  HPI: Theodore Weber is a 67 y.o. male with past medical history of R renal mass, HTN, and ETOH abuse and chronic systolic CHF (EF 16-10%). S/p R nephrectomy 03/2017.   Admitted 8/18 to [facility] with a several day history of flank pain. CT and US revealed renal cell CA.  Plan was for an elective R nephrectomy. On presentation to the OR he was in Afib RVR, so case was cancelled. Underwent nuclear stress test that showed no ischemia, severe LV HK with EF estimated 12%. Echo with EF 20-25%. He underwent TEE/DCCV, with return to NSR. He was started on Eliquis and amiodarone.   In November 2020 had a crush injury to his left hand. Eventually lost his left index finger. 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 51 runs of brief SVT occurred, the run with the fastestinterval lasting 6 beats with a max rate of 207 bpm, the longest lasting 14 beats with an avg rate of 105 bpm.  Isolated PACs were occasional (1.7%,14998) Isolated PVCs were occasional (3.1%, 96045). Ventricular bigeminy and rrigeminy were present. 6. Four patient-triggered events noted 3 associated with sinus rhythm. One associated with isolated PVC. 7. No AF noted - Echo 11/06/19 EF 50%  - Echo 12/23 EF 45-50%   Today he AHF follow up. Overall feeling ok. Denies palpitations, CP, dizziness, edema, or PND/Orthopnea. SOB with significant exertion. Had been dealing with depression over the summer, tried some medications that did not work for him. Appetite good. No fever or chills. Weight at home 209 pounds (working on losing 9lbs). Checks BP at home, this morning SBP 119/78s .Taking all medications. Occasional ETOH use, denies smoking. Goes out to walk 4-5 times a week. Working part time at [employer].   Past Medical History:   Diagnosis Date   Atrial fibrillation ([facility]) 12/2016   Cancer ([facility])    kidney   Dysrhythmia    HOH (hard of hearing)    Hypertension    Renal mass 12/2016   Systolic dysfunction with acute on chronic heart failure (HCC) 01/15/2017   Wears glasses    Current Outpatient Medications  Medication Sig Dispense Refill   apixaban (ELIQUIS) 5 MG TABS tablet TAKE 1 TABLET(5 MG) BY MOUTH TWICE DAILY 60 tablet 2   empagliflozin (JARDIANCE) 10 MG TABS tablet Take 1 tablet (10 mg total) by mouth daily before breakfast. 30 tablet 11   levothyroxine (SYNTHROID, LEVOTHROID) 75 MCG tablet Take 75 mcg by mouth daily before breakfast.     losartan (COZAAR) 25 MG tablet TAKE 1 TABLET(25 MG) BY MOUTH DAILY 90 tablet 3   metoprolol succinate (TOPROL XL) 25 MG 24 hr tablet Take 1 tablet (25 mg total) by mouth daily. 30 tablet 11   OIL OF OREGANO PO Take 1 each by mouth See admin instructions. Take 1 dropper full by mouth daily     spironolactone (ALDACTONE) 25 MG tablet TAKE 1/2 TABLET(12.5 MG) BY MOUTH AT BEDTIME 15 tablet 4   sildenafil (VIAGRA) 100 MG tablet Take 1 tablet (100 mg total) by mouth as needed for erectile dysfunction. 6 tablet 3   No current facility-administered medications for this encounter.   No Known Allergies  Social History   Socioeconomic History   Marital status: Married    Spouse  name: Not on file   Number of children: Not on file   Years of education: Not on file   Highest education level: Not on file  Occupational History   Not on file  Tobacco Use   Smoking status: Former    Types: Cigars    Quit date: 01/09/2017    Years since quitting: 6.3   Smokeless tobacco: Current    Types: Snuff  Vaping Use   Vaping status: Never Used  Substance and Sexual Activity   Alcohol use: Yes    Alcohol/week: 3.0 standard drinks of alcohol    Types: 3 Cans of beer per week    Comment: Occasionally   Drug use: No   Sexual activity: Not on file  Other Topics Concern   Not on file   Social History Narrative   Not on file   Social Drivers of Health   Financial Resource Strain: Not on file  Food Insecurity: Not on file  Transportation Needs: Not on file  Physical Activity: Not on file  Stress: Not on file  Social Connections: Not on file  Intimate Partner Violence: Not on file   Family History  Problem Relation Age of Onset   Hypertension Father    Vitals:   05/24/23 0925  BP: (!) 142/96  Pulse: 73  SpO2: 100%  Weight: 98.3 kg (216 lb 12.8 oz)  Height: 6\' 3"  (1.905 m)    Wt Readings from Last 3 Encounters:  05/24/23 98.3 kg (216 lb 12.8 oz)  05/21/22 94.8 kg (209 lb)  05/16/21 93.3 kg (205 lb 9.6 oz)   PHYSICAL EXAM: General:  well appearing.  No respiratory difficulty HEENT: normal Neck: supple. JVD ~7 cm. Carotids 2+ bilat; no bruits. No lymphadenopathy or thyromegaly appreciated. Cor: PMI nondisplaced. Regular rate & rhythm. No rubs, gallops or murmurs. Lungs: clear Abdomen: soft, nontender, nondistended. No hepatosplenomegaly. No bruits or masses. Good bowel sounds. Extremities: no cyanosis, clubbing, rash, edema  Neuro: alert & oriented x 3, cranial nerves grossly intact. moves all 4 extremities w/o difficulty. Affect pleasant.   ECG: NSR 70 bpm (Personally reviewed)    ASSESSMENT & PLAN: 1. Chronic systolic CHF due to NICM with recovered EF: -  Echo 01/13/17 LVEF 20-25%, trivial MR, mild LAE, RV mildly elevated, mild RAE. R/L heart cath Aug. 2016 with normal cors. CM felt to be related to ETOH and tachy-mediated. cMRI with EF 24%.  - ECHO 04/2017 EF 40-45%. Echo 12/19 EF 50-55%. Echo 6/21 EF 50%. Echo 12//23 EF 45-50% - Echo today, official read pending - Doing great. Stable NYHA I-II - Volume status stable - Increase spiro 12.5>25 mg at bedtime/ BMET/BNP today. Repeat BMET 1 week.  - Continue losartan 25 mg qhs. - Continue Jardiance 10  - Continue Toprol 25  2. Atrial fibrillation: s/p DCCV - CHA2DS2/VASc at least 2.  - Continue  Eliquis 5 mg BID. No bleeding. CBC today.  - Zio 4/21. No AF - Maintaining NSR off amio   3. NSVT - Continue b-blocker  4. R Renal mass - s/p R renal nephrectomy 03/15/17.  - No change - Previously followed by Dr. Laverle Patter. No evidence of recurrence on recent scans. No longer has to f/u with him anymore, per Dr. Laverle Patter.  5. Alcohol Abuse - Drinks occasional beer  6. CKD 3a-3b - Baseline creatinine about 1.6-1.9  - [facility]  - Continue SGLT2i  7. Hypothroidism - Followed by Dr Sharl Ma in Endo   Follow up 1 year with  Dr. Gala Romney. Knows to call if he needs to be seen sooner.   Alen Bleacher, NP 05/24/23

## 2023-05-24 ENCOUNTER — Ambulatory Visit (HOSPITAL_COMMUNITY)
Admission: RE | Admit: 2023-05-24 | Discharge: 2023-05-24 | Disposition: A | Discharge status: Home / Self Care | Payer: PPO | Source: Ambulatory Visit | Attending: Internal Medicine | Admitting: Internal Medicine

## 2023-05-24 ENCOUNTER — Ambulatory Visit (HOSPITAL_BASED_OUTPATIENT_CLINIC_OR_DEPARTMENT_OTHER)
Admission: RE | Admit: 2023-05-24 | Discharge: 2023-05-24 | Disposition: A | Discharge status: Home / Self Care | Payer: PPO | Source: Ambulatory Visit | Attending: Family Medicine | Admitting: Family Medicine

## 2023-05-24 ENCOUNTER — Encounter (HOSPITAL_COMMUNITY): Payer: Self-pay

## 2023-05-24 VITALS — BP 142/96 | HR 73 | Ht 75.0 in | Wt 216.8 lb

## 2023-05-24 DIAGNOSIS — E039 Hypothyroidism, unspecified: Secondary | ICD-10-CM

## 2023-05-24 DIAGNOSIS — I5022 Chronic systolic (congestive) heart failure: Secondary | ICD-10-CM

## 2023-05-24 DIAGNOSIS — Z87891 Personal history of nicotine dependence: Secondary | ICD-10-CM | POA: Diagnosis not present

## 2023-05-24 DIAGNOSIS — I428 Other cardiomyopathies: Secondary | ICD-10-CM | POA: Insufficient documentation

## 2023-05-24 DIAGNOSIS — F1010 Alcohol abuse, uncomplicated: Secondary | ICD-10-CM

## 2023-05-24 DIAGNOSIS — I081 Rheumatic disorders of both mitral and tricuspid valves: Secondary | ICD-10-CM | POA: Insufficient documentation

## 2023-05-24 DIAGNOSIS — I4891 Unspecified atrial fibrillation: Secondary | ICD-10-CM | POA: Diagnosis not present

## 2023-05-24 DIAGNOSIS — N1831 Chronic kidney disease, stage 3a: Secondary | ICD-10-CM | POA: Insufficient documentation

## 2023-05-24 DIAGNOSIS — N2889 Other specified disorders of kidney and ureter: Secondary | ICD-10-CM | POA: Diagnosis not present

## 2023-05-24 DIAGNOSIS — I4729 Other ventricular tachycardia: Secondary | ICD-10-CM

## 2023-05-24 DIAGNOSIS — I480 Paroxysmal atrial fibrillation: Secondary | ICD-10-CM

## 2023-05-24 DIAGNOSIS — N1832 Chronic kidney disease, stage 3b: Secondary | ICD-10-CM

## 2023-05-24 DIAGNOSIS — I130 Hypertensive heart and chronic kidney disease with heart failure and stage 1 through stage 4 chronic kidney disease, or unspecified chronic kidney disease: Secondary | ICD-10-CM | POA: Diagnosis present

## 2023-05-24 LAB — CBC
HCT: 43.8 % (ref 39.0–52.0)
Hemoglobin: 14.7 g/dL (ref 13.0–17.0)
MCH: 31.1 pg (ref 26.0–34.0)
MCHC: 33.6 g/dL (ref 30.0–36.0)
MCV: 92.6 fL (ref 80.0–100.0)
Platelets: 316 10*3/uL (ref 150–400)
RBC: 4.73 MIL/uL (ref 4.22–5.81)
RDW: 12.6 % (ref 11.5–15.5)
WBC: 8.7 10*3/uL (ref 4.0–10.5)
nRBC: 0 % (ref 0.0–0.2)

## 2023-05-24 LAB — ECHOCARDIOGRAM COMPLETE
Area-P 1/2: 3.54 cm2
Calc EF: 54.1 %
Single Plane A2C EF: 52.2 %
Single Plane A4C EF: 54.5 %

## 2023-05-24 LAB — BASIC METABOLIC PANEL
Anion gap: 6 (ref 5–15)
BUN: 22 mg/dL (ref 8–23)
CO2: 24 mmol/L (ref 22–32)
Calcium: 9.2 mg/dL (ref 8.9–10.3)
Chloride: 103 mmol/L (ref 98–111)
Creatinine, Ser: 1.7 mg/dL — ABNORMAL HIGH (ref 0.61–1.24)
GFR, Estimated: 44 mL/min — ABNORMAL LOW (ref >60)
Glucose, Bld: 105 mg/dL — ABNORMAL HIGH (ref 70–99)
Potassium: 4.6 mmol/L (ref 3.5–5.1)
Sodium: 133 mmol/L — ABNORMAL LOW (ref 135–145)

## 2023-05-24 LAB — BRAIN NATRIURETIC PEPTIDE: B Natriuretic Peptide: 69.7 pg/mL (ref 0.0–100.0)

## 2023-05-24 MED ORDER — SPIRONOLACTONE 25 MG PO TABS: 25.0000 mg | ORAL_TABLET | Freq: Every evening | ORAL | 6 refills | Status: AC

## 2023-05-24 NOTE — Patient Instructions (Addendum)
Thank you for coming in today  If you had labs drawn today, any labs that are abnormal the clinic will call you No news is good news  Medications: Increase Spironolactone to 25 mg nightly  Follow up appointments:  Your physician recommends that you schedule a follow-up appointment in:  1 year With Dr. Luanna Cole will receive a reminder letter in the mail a few months in advance. If you don't receive a letter, please call our office to schedule the follow-up appointment.   Do the following things EVERYDAY: Weigh yourself in the morning before breakfast. Write it down and keep it in a log. Take your medicines as prescribed Eat low salt foods--Limit salt (sodium) to 2000 mg per day.  Stay as active as you can everyday Limit all fluids for the day to less than 2 liters   At the [facility], you and your health needs are our priority. As part of our continuing mission to provide you with exceptional heart care, we have created designated Provider Care Teams. These Care Teams include your primary Cardiologist (physician) and Advanced Practice Providers (APPs- Physician Assistants and Nurse Practitioners) who all work together to provide you with the care you need, when you need it.   You may see any of the following providers on your designated Care Team at your next follow up: Dr Arvilla Meres Dr Marca Ancona Dr. Marcos Eke, NP Robbie Lis, [place] [facility] [place], [place] Brynda Peon, NP Karle Plumber, PharmD   Please be sure to bring in all your medications bottles to every appointment.    Thank you for choosing [facility]  If you have any questions or concerns before your next appointment please send [place] a message through [place] or call our office at [phone].    TO LEAVE A MESSAGE FOR THE NURSE SELECT OPTION 2, PLEASE LEAVE A MESSAGE INCLUDING: YOUR NAME DATE OF BIRTH CALL BACK  NUMBER REASON FOR CALL**this is important as we prioritize the call backs  YOU WILL RECEIVE A CALL BACK THE SAME DAY AS LONG AS YOU CALL BEFORE 4:00 PM

## 2023-06-03 ENCOUNTER — Ambulatory Visit (HOSPITAL_COMMUNITY)
Admission: RE | Admit: 2023-06-03 | Discharge: 2023-06-03 | Disposition: A | Discharge status: Home / Self Care | Payer: PPO | Source: Ambulatory Visit | Attending: Cardiology | Admitting: Cardiology

## 2023-06-03 DIAGNOSIS — I5022 Chronic systolic (congestive) heart failure: Secondary | ICD-10-CM | POA: Diagnosis present

## 2023-06-03 LAB — BASIC METABOLIC PANEL
Anion gap: 8 (ref 5–15)
BUN: 23 mg/dL (ref 8–23)
CO2: 24 mmol/L (ref 22–32)
Calcium: 9 mg/dL (ref 8.9–10.3)
Chloride: 103 mmol/L (ref 98–111)
Creatinine, Ser: 1.58 mg/dL — ABNORMAL HIGH (ref 0.61–1.24)
GFR, Estimated: 48 mL/min — ABNORMAL LOW (ref >60)
Glucose, Bld: 104 mg/dL — ABNORMAL HIGH (ref 70–99)
Potassium: 4.6 mmol/L (ref 3.5–5.1)
Sodium: 135 mmol/L (ref 135–145)

## 2024-02-03 ENCOUNTER — Other Ambulatory Visit (HOSPITAL_COMMUNITY): Payer: Self-pay | Admitting: *Deleted

## 2024-02-03 ENCOUNTER — Telehealth (HOSPITAL_COMMUNITY): Payer: Self-pay | Admitting: Internal Medicine

## 2024-02-03 DIAGNOSIS — I5022 Chronic systolic (congestive) heart failure: Secondary | ICD-10-CM

## 2024-05-11 ENCOUNTER — Encounter (HOSPITAL_COMMUNITY): Admitting: Internal Medicine

## 2024-05-12 ENCOUNTER — Ambulatory Visit (HOSPITAL_COMMUNITY)
Admission: RE | Admit: 2024-05-12 | Discharge: 2024-05-12 | Disposition: A | Source: Ambulatory Visit | Attending: Internal Medicine | Admitting: Internal Medicine

## 2024-05-12 ENCOUNTER — Encounter (HOSPITAL_COMMUNITY): Payer: Self-pay | Admitting: Internal Medicine

## 2024-05-12 VITALS — BP 120/88 | HR 65 | Wt 212.6 lb

## 2024-05-12 DIAGNOSIS — I480 Paroxysmal atrial fibrillation: Secondary | ICD-10-CM | POA: Diagnosis not present

## 2024-05-12 DIAGNOSIS — I5022 Chronic systolic (congestive) heart failure: Secondary | ICD-10-CM

## 2024-05-12 DIAGNOSIS — N1832 Chronic kidney disease, stage 3b: Secondary | ICD-10-CM | POA: Diagnosis not present

## 2024-05-12 DIAGNOSIS — I4729 Other ventricular tachycardia: Secondary | ICD-10-CM | POA: Diagnosis not present

## 2024-05-12 LAB — ECHOCARDIOGRAM COMPLETE
AR max vel: 4.47 cm2
AV Area VTI: 3.98 cm2
AV Area mean vel: 3.71 cm2
AV Mean grad: 2 mmHg
AV Peak grad: 4.3 mmHg
Ao pk vel: 1.04 m/s
Area-P 1/2: 4.06 cm2
Calc EF: 59 %
MV VTI: 3.73 cm2
S' Lateral: 3.6 cm
Single Plane A2C EF: 61.1 %
Single Plane A4C EF: 56.5 %

## 2024-05-12 NOTE — Progress Notes (Signed)
°  Echocardiogram 2D Echocardiogram has been performed.  Norleen ORN [facility] 05/12/2024, 8:27 AM

## 2024-05-12 NOTE — Progress Notes (Signed)
 [facility] Note   Referring Physician: Dr. Court Primary Care: No PCP Primary Cardiologist: Dr. Sarina. Cherrie   CC: Heart failure follow-up  HPI: Theodore Weber is a 68 y.o. male with past medical history of R renal mass, HTN, and ETOH abuse and chronic systolic CHF (EF 79-74%). S/p R nephrectomy 03/2017.   Admitted 8/18 to [facility] with a several day history of flank pain. CT and US  revealed renal cell CA.  Plan was for an elective R nephrectomy. On presentation to the OR he was in Afib RVR, so case was cancelled. Underwent nuclear stress test that showed no ischemia, severe LV HK with EF estimated 12%. Echo with EF 20-25%. He underwent TEE/DCCV, with return to NSR. He was started on Eliquis  and amiodarone .   In November 2020 had a crush injury to his left hand. Eventually lost his left index finger. 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 51 runs of brief SVT occurred, the run with the fastestinterval lasting 6 beats with a max rate of 207 bpm, the longest lasting 14 beats with an avg rate of 105 bpm.  Isolated PACs were occasional (1.7%,14998) Isolated PVCs were occasional (3.1%, 72950). Ventricular bigeminy and rrigeminy were present. 6. Four patient-triggered events noted 3 associated with sinus rhythm. One associated with isolated PVC. 7. No AF noted - Echo 11/06/19 EF 50%  - Echo 12/23 EF 45-50%   Today he AHF follow up. Doing really well. 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 Denies CP, SOB, edema, palpitations. Complaint with meds. No recurrent AF. Walks 3 miles per day without CP or SOB.   Echo today 05/12/24 EF 55% Personally reviewed  Past Medical History:  Diagnosis Date   Atrial fibrillation (HCC) 12/2016   Cancer (HCC)    kidney   Dysrhythmia    HOH (hard of hearing)    Hypertension    Renal mass 12/2016   Systolic dysfunction with acute on chronic heart failure (HCC) 01/15/2017   Wears glasses     Current Outpatient Medications  Medication Sig Dispense Refill   apixaban  (ELIQUIS ) 5 MG TABS tablet TAKE 1 TABLET(5 MG) BY MOUTH TWICE DAILY 60 tablet 2   empagliflozin  (JARDIANCE ) 10 MG TABS tablet Take 1 tablet (10 mg total) by mouth daily before breakfast. 30 tablet 11   levothyroxine  (SYNTHROID , LEVOTHROID) 75 MCG tablet Take 75 mcg by mouth daily before breakfast.     losartan  (COZAAR ) 25 MG tablet TAKE 1 TABLET(25 MG) BY MOUTH DAILY 90 tablet 3   metoprolol  succinate (TOPROL  XL) 25 MG 24 hr tablet Take 1 tablet (25 mg total) by mouth daily. 30 tablet 11   OIL OF OREGANO PO Take 1 each by mouth See admin instructions. Take 1 dropper full by mouth daily     sildenafil  (VIAGRA ) 100 MG tablet Take 1 tablet (100 mg total) by mouth as needed for erectile dysfunction. 6 tablet 3   spironolactone  (ALDACTONE ) 25 MG tablet Take 1 tablet (25 mg total) by mouth at bedtime. 30 tablet 6   No current facility-administered medications for this encounter.   No Known Allergies  Social History   Socioeconomic History   Marital status: Married    Spouse name: Not on file   Number of children: Not on file   Years of education: Not on file   Highest education level: Not on file  Occupational History   Not on file  Tobacco Use   Smoking status: Former    Types: Cigars    Quit date: 01/09/2017    Years since quitting: 7.3   Smokeless tobacco: Current    Types: Snuff  Vaping Use   Vaping status: Never Used  Substance and Sexual Activity   Alcohol use: Yes    Alcohol/week: 3.0 standard drinks of alcohol    Types: 3 Cans of beer per week    Comment: Occasionally   Drug use: No   Sexual activity: Not on file  Other Topics Concern   Not on file  Social History Narrative   Not on file   Social Drivers of Health   Tobacco Use: High Risk (05/12/2024)   Patient History    Smoking Tobacco Use: Former    Smokeless Tobacco Use: Current    Passive Exposure: Not on [place]  Strain: Not on file  Food Insecurity: Not on file  Transportation Needs: Not on file  Physical Activity: Not on file  Stress: Not on file  Social Connections: Not on file  Intimate Partner Violence: Not on file  Depression (PHQ2-9): Not on file  Alcohol Screen: Not on file  Housing: Not on file  Utilities: Not on file  Health Literacy: Not on file   Family History  Problem Relation Age of Onset   Hypertension Father    Vitals:   05/12/24 0840  BP: 120/88  Pulse: 65  SpO2: 99%  Weight: 96.4 kg (212 lb 9.6 oz)    Wt Readings from Last 3 Encounters:  05/12/24 96.4 kg (212 lb 9.6 oz)  05/24/23 98.3 kg (216 lb 12.8 oz)  05/21/22 94.8 kg (209 lb)   PHYSICAL EXAM: General:  Sitting up. No resp difficulty HEENT: normal Neck: supple. no JVD.  Cor: Regular rate & rhythm. No rubs, gallops or murmurs. Lungs: clear Abdomen: soft, nontender, nondistended.Good bowel sounds. Extremities: no cyanosis, clubbing, rash, edema Neuro: alert & orientedx3, cranial nerves grossly intact. moves all 4 extremities w/o difficulty. Affect pleasant   ECG: NSR 67 No ST-T wave abnormalities.    ASSESSMENT & PLAN: 1. Chronic systolic CHF due to NICM with recovered EF: -  Echo 01/13/17 LVEF 20-25%, trivial MR, mild LAE, RV mildly elevated, mild RAE. R/L heart cath Aug. 2016 with normal cors. CM felt to be related to ETOH and tachy-mediated. cMRI with EF 24%.  - ECHO 04/2017 EF 40-45%. Echo 12/19 EF 50-55%. Echo 6/21 EF 50%. Echo 12//23 EF 45-50% - Echo 12/24 EF 50-55% - Echo today 05/12/24 EF 55% Personally reviewed - Doing well NYHA I. Volume ok - Volume status stable - Continue spiro 25 daily - Continue losartan  25 mg qhs. - Continue Jardiance  10  - Continue Toprol  25 - [facility] at [facility] and says everything was ok   2. Atrial fibrillation: s/p DCCV - CHA2DS2/VASc at least 2.  - Continue Eliquis  5 bid. No bleeding - Zio 4/21. No AF - Maintaining NSR off amio   3. NSVT - Continue  b-blocker -continue current meds  4. R Renal mass - s/p R renal nephrectomy 03/15/17.  - No recurrence  5. Alcohol Abuse - Drinks occasional beer  6. CKD 3a-3b - Baseline creatinine about 1.6-1.9  - [facility]  - Continue SGLT2i   Toribio Fuel, MD 05/12/2024

## 2024-05-12 NOTE — Patient Instructions (Signed)
° °  Follow-Up in: 12 MONTHS PLEASE CALL OUR OFFICE AROUND OCTOBER 2026 TO GET SCHEDULED FOR YOUR APPOINTMENT. PHONE NUMBER IS [phone] OPTION 2   At the [facility], you and your health needs are our priority. We have a designated team specialized in the treatment of Heart Failure. This Care Team includes your primary Heart Failure Specialized Cardiologist (physician), Advanced Practice Providers (APPs- Physician Assistants and Nurse Practitioners), and Pharmacist who all work together to provide you with the care you need, when you need it.   You may see any of the following providers on your designated Care Team at your next follow up:  Dr. Toribio Fuel Dr. Ezra Shuck Dr. Odis Brownie Greig Mosses, NP Caffie Shed, [place] [facility] [place], [place] Beckey Coe, NP Jordan Lee, NP Tinnie Redman, PharmD   Please be sure to bring in all your medications bottles to every appointment.   Need to Contact Us :  If you have any questions or concerns before your next appointment please send us  a message through [place] or call our office at [phone].    TO LEAVE A MESSAGE FOR THE NURSE SELECT OPTION 2, PLEASE LEAVE A MESSAGE INCLUDING: YOUR NAME DATE OF BIRTH CALL BACK NUMBER REASON FOR CALL**this is important as we prioritize the call backs  YOU WILL RECEIVE A CALL BACK THE SAME DAY AS LONG AS YOU CALL BEFORE 4:00 PM

## 2024-05-12 NOTE — Addendum Note (Signed)
 Encounter addended by: Lindsay Straka B, RN on: 05/12/2024 9:37 AM  Actions taken: Clinical Note Signed
